# Patient Record
Sex: Female | Born: 1990
Health system: Southern US, Community
[De-identification: ages and names within clinical notes are randomized; demographics above are authoritative.]

## PROBLEM LIST (undated history)

## (undated) DIAGNOSIS — K900 Celiac disease: Secondary | ICD-10-CM

## (undated) DIAGNOSIS — K219 Gastro-esophageal reflux disease without esophagitis: Secondary | ICD-10-CM

## (undated) DIAGNOSIS — D649 Anemia, unspecified: Secondary | ICD-10-CM

## (undated) DIAGNOSIS — F419 Anxiety disorder, unspecified: Secondary | ICD-10-CM

## (undated) DIAGNOSIS — F502 Bulimia nervosa, unspecified: Secondary | ICD-10-CM

## (undated) DIAGNOSIS — T7840XA Allergy, unspecified, initial encounter: Secondary | ICD-10-CM

## (undated) DIAGNOSIS — R630 Anorexia: Secondary | ICD-10-CM

## (undated) DIAGNOSIS — F32A Depression, unspecified: Secondary | ICD-10-CM

## (undated) HISTORY — PX: TONSILLECTOMY: SUR1361

## (undated) HISTORY — DX: Celiac disease: K90.0

## (undated) HISTORY — DX: Allergy, unspecified, initial encounter: T78.40XA

## (undated) HISTORY — DX: Depression, unspecified: F32.A

## (undated) HISTORY — DX: Anxiety disorder, unspecified: F41.9

## (undated) HISTORY — DX: Bulimia nervosa, unspecified: F50.20

## (undated) HISTORY — DX: Gastro-esophageal reflux disease without esophagitis: K21.9

## (undated) HISTORY — DX: Anemia, unspecified: D64.9

## (undated) HISTORY — DX: Bulimia nervosa: F50.2

## (undated) HISTORY — PX: BIOPSY BOWEL: PRO7

## (undated) HISTORY — DX: Anorexia: R63.0

## (undated) HISTORY — PX: INCISION AND DRAINAGE: SHX5863

---
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
## 2012-01-02 ENCOUNTER — Other Ambulatory Visit: Payer: Self-pay | Admitting: Otolaryngology

## 2012-01-02 NOTE — H&P (Signed)
  Beth Hayes, Beth Hayes 21 y.o., female [id number]     Chief Complaint: Recurrent Tonsillitis  HPI:   Several months recheck.  She continues to have occasional sore throats off at [employer].  We are planning on doing tonsillectomy, possible adenoidectomy over her Christmas break.   We discussed the surgery in detail including risks and complications.  Questions were answered and informed consent was obtained.  Postoperative prescriptions for Roxicet and Lortab liquid given.  Preoperative prophylactic prescription for amoxicillin tablets given.  I discussed advancement of diet and activity including return to school.  Recheck here one week postoperative.  She and her father understand and agree.  PMH: Past Medical History  Diagnosis Date  . Celiac sprue   . Nasal congestion 12/30/2011  . Recurrent tonsillitis 12/2011  . Amoxicillin rash 12/30/2011    Surg Hx: Past Surgical History  Procedure Date  . Tonsillectomy 11/26/2010    Procedure: TONSILLECTOMY;  Surgeon: Tyson Alias;  Location: [facility];  Service: ENT;  Laterality: Right;  I & D right peritonsillar abscess  . Incision and drainage of peritonsillar abcess 11/26/2010    was not a tonsillectomy  . Biopsy bowel age 55 mos. and age 70    FHx:  No family history on file. SocHx:  reports that she has never smoked. She has never used smokeless tobacco. She reports that she does not drink alcohol or use illicit drugs.  ALLERGIES:  Allergies  Allergen Reactions  . Gluten Meal Other (See Comments)    DUE TO CELIAC DISEASE  . Amoxicillin Rash     (Not in a hospital admission)  No results found for this or any previous visit (from the past 48 hour(s)). No results found.   Last menstrual period 12/21/2011.  PHYSICAL EXAM:  BP:102/65,  HR: 59 b/min,  Height: 59 in, 2-20 Stature Percentile: 1 %,  Weight: 83 lb, BMI: 16.8 kg/m2,    She is short and trim.  Mental status is appropriate.  She hears well in  conversational speech.  Voice is clear and respirations unlabored through the nose.  No velopharyngeal insufficiency.  Ears are clear.  Anterior nose is moist and patent.  Oral cavity reveals teeth in good repair.  3+ tonsils with a normal soft palate.  Neck unremarkable.   Lungs: Clear to auscultation Heart: Regular rate and rhythm without murmur Abdomen: Soft, active Extremities: Normal configuration Neurologic: Symmetric and intact    Assessment/Plan Peritonsillar abscess (475) (J36).  Chronic tonsillitis (474.00) (J35.01).  We will remove your tonsils, and adenoids, if any.  Plan on being out of most activities for the first week, and no strenuous activity for 2 weeks.  You should be safe to travel to [place].  Recheck here 1 week post op.  Drink plenty of fluids, and advance diet as comfortable.  OxyCODONE HCl - 5 MG/5ML Oral Solution;USE AS DIRECTED; Qty200; R0; Rx. Hydrocodone-Acetaminophen 7.5-500 MG/15ML Oral Solution;1-3 tsp po q4h prn pain; FYB017; R2; Rx.  Erik Obey, Nalia Honeycutt 51/02/5850, 4:55 PM

## 2012-01-03 ENCOUNTER — Ambulatory Visit (HOSPITAL_BASED_OUTPATIENT_CLINIC_OR_DEPARTMENT_OTHER)
Admission: RE | Admit: 2012-01-03 | Discharge: 2012-01-03 | Disposition: A | Discharge status: Home / Self Care | Payer: 59 | Source: Ambulatory Visit | Attending: Otolaryngology | Admitting: Otolaryngology

## 2012-01-03 ENCOUNTER — Encounter (HOSPITAL_BASED_OUTPATIENT_CLINIC_OR_DEPARTMENT_OTHER): Payer: Self-pay | Admitting: *Deleted

## 2012-01-03 ENCOUNTER — Encounter (HOSPITAL_BASED_OUTPATIENT_CLINIC_OR_DEPARTMENT_OTHER): Payer: Self-pay | Admitting: Anesthesiology

## 2012-01-03 ENCOUNTER — Ambulatory Visit (HOSPITAL_BASED_OUTPATIENT_CLINIC_OR_DEPARTMENT_OTHER): Payer: 59 | Admitting: Anesthesiology

## 2012-01-03 ENCOUNTER — Encounter (HOSPITAL_BASED_OUTPATIENT_CLINIC_OR_DEPARTMENT_OTHER)
Admission: RE | Disposition: A | Discharge status: Home / Self Care | Payer: Self-pay | Source: Ambulatory Visit | Attending: Otolaryngology

## 2012-01-03 DIAGNOSIS — J3501 Chronic tonsillitis: Secondary | ICD-10-CM

## 2012-01-03 DIAGNOSIS — J3503 Chronic tonsillitis and adenoiditis: Secondary | ICD-10-CM | POA: Insufficient documentation

## 2012-01-03 DIAGNOSIS — Z880 Allergy status to penicillin: Secondary | ICD-10-CM | POA: Insufficient documentation

## 2012-01-03 HISTORY — PX: TONSILLECTOMY: SHX5217

## 2012-01-03 HISTORY — PX: ADENOIDECTOMY: SHX5191

## 2012-01-03 LAB — POCT HEMOGLOBIN-HEMACUE: Hemoglobin: 13.3 g/dL (ref 12.0–15.0)

## 2012-01-03 SURGERY — TONSILLECTOMY
Anesthesia: General | Site: Throat | Wound class: Clean Contaminated

## 2012-01-03 MED ORDER — LIDOCAINE-EPINEPHRINE 0.5 %-1:200000 IJ SOLN
INTRAMUSCULAR | Status: DC | PRN
  Administered 2012-01-03: 9 mL

## 2012-01-03 MED ORDER — HYDROMORPHONE HCL PF 1 MG/ML IJ SOLN
0.2500 mg | INTRAMUSCULAR | Status: DC | PRN
  Administered 2012-01-03 (×2): 0.5 mg via INTRAVENOUS

## 2012-01-03 MED ORDER — SODIUM CHLORIDE 0.9 % IV SOLN: 1.0000 g | Freq: Once | INTRAVENOUS | Status: DC

## 2012-01-03 MED ORDER — MEPERIDINE HCL 25 MG/ML IJ SOLN: 6.2500 mg | INTRAMUSCULAR | Status: DC | PRN

## 2012-01-03 MED ORDER — MIDAZOLAM HCL 5 MG/5ML IJ SOLN
INTRAMUSCULAR | Status: DC | PRN
  Administered 2012-01-03: 1 mg via INTRAVENOUS

## 2012-01-03 MED ORDER — CLINDAMYCIN PHOSPHATE 600 MG/50ML IV SOLN
600.0000 mg | Freq: Once | INTRAVENOUS | Status: AC
  Administered 2012-01-03: 600 mg via INTRAVENOUS

## 2012-01-03 MED ORDER — OXYCODONE HCL 5 MG/5ML PO SOLN: 5.0000 mg | Freq: Once | ORAL | Status: DC | PRN

## 2012-01-03 MED ORDER — ONDANSETRON HCL 4 MG/2ML IJ SOLN
INTRAMUSCULAR | Status: DC | PRN
  Administered 2012-01-03: 4 mg via INTRAVENOUS

## 2012-01-03 MED ORDER — SUCCINYLCHOLINE CHLORIDE 20 MG/ML IJ SOLN
INTRAMUSCULAR | Status: DC | PRN
  Administered 2012-01-03: 50 mg via INTRAVENOUS

## 2012-01-03 MED ORDER — SODIUM CHLORIDE 0.9 % IV SOLN
INTRAVENOUS | Status: DC
  Administered 2012-01-03: 100 mL/h via INTRAVENOUS

## 2012-01-03 MED ORDER — HYDROCODONE-ACETAMINOPHEN 7.5-500 MG/15ML PO SOLN
10.0000 mL | ORAL | Status: DC | PRN
  Administered 2012-01-03: 10 mL via ORAL

## 2012-01-03 MED ORDER — DEXAMETHASONE SODIUM PHOSPHATE 10 MG/ML IJ SOLN: 6.0000 mg | Freq: Once | INTRAMUSCULAR | Status: DC

## 2012-01-03 MED ORDER — DEXAMETHASONE SODIUM PHOSPHATE 4 MG/ML IJ SOLN
INTRAMUSCULAR | Status: DC | PRN
  Administered 2012-01-03: 6 mg via INTRAVENOUS

## 2012-01-03 MED ORDER — FENTANYL CITRATE 0.05 MG/ML IJ SOLN
INTRAMUSCULAR | Status: DC | PRN
  Administered 2012-01-03 (×2): 50 ug via INTRAVENOUS

## 2012-01-03 MED ORDER — ONDANSETRON HCL 4 MG/2ML IJ SOLN: 4.0000 mg | INTRAMUSCULAR | Status: DC | PRN

## 2012-01-03 MED ORDER — PROMETHAZINE HCL 25 MG/ML IJ SOLN: 6.2500 mg | INTRAMUSCULAR | Status: DC | PRN

## 2012-01-03 MED ORDER — LACTATED RINGERS IV SOLN
INTRAVENOUS | Status: DC
  Administered 2012-01-03: 20 mL/h via INTRAVENOUS
  Administered 2012-01-03: 09:00:00 via INTRAVENOUS

## 2012-01-03 MED ORDER — OXYCODONE HCL 5 MG PO TABS: 5.0000 mg | ORAL_TABLET | Freq: Once | ORAL | Status: DC | PRN

## 2012-01-03 MED ORDER — ONDANSETRON HCL 4 MG PO TABS: 4.0000 mg | ORAL_TABLET | ORAL | Status: DC | PRN

## 2012-01-03 MED ORDER — PROPOFOL 10 MG/ML IV BOLUS
INTRAVENOUS | Status: DC | PRN
  Administered 2012-01-03: 150 mg via INTRAVENOUS

## 2012-01-03 MED ORDER — LIDOCAINE HCL (CARDIAC) 20 MG/ML IV SOLN
INTRAVENOUS | Status: DC | PRN
  Administered 2012-01-03: 50 mg via INTRAVENOUS

## 2012-01-03 SURGICAL SUPPLY — 33 items
CANISTER SUCTION 1200CC (MISCELLANEOUS) ×2 IMPLANT
CATH ROBINSON RED A/P 10FR (CATHETERS) ×2 IMPLANT
CLEANER CAUTERY TIP 5X5 PAD (MISCELLANEOUS) ×1 IMPLANT
CLOTH BEACON ORANGE TIMEOUT ST (SAFETY) ×2 IMPLANT
COAGULATOR SUCT SWTCH 10FR 6 (ELECTROSURGICAL) ×2 IMPLANT
COVER MAYO STAND STRL (DRAPES) ×2 IMPLANT
DECANTER SPIKE VIAL GLASS SM (MISCELLANEOUS) IMPLANT
ELECT COATED BLADE 2.86 ST (ELECTRODE) ×2 IMPLANT
ELECT REM PT RETURN 9FT ADLT (ELECTROSURGICAL) ×2
ELECT REM PT RETURN 9FT PED (ELECTROSURGICAL)
ELECTRODE REM PT RETRN 9FT PED (ELECTROSURGICAL) IMPLANT
ELECTRODE REM PT RTRN 9FT ADLT (ELECTROSURGICAL) ×1 IMPLANT
GAUZE SPONGE 4X4 12PLY STRL LF (GAUZE/BANDAGES/DRESSINGS) ×2 IMPLANT
GLOVE ECLIPSE 8.0 STRL XLNG CF (GLOVE) ×2 IMPLANT
GLOVE SKINSENSE NS SZ7.0 (GLOVE) ×1
GLOVE SKINSENSE STRL SZ7.0 (GLOVE) ×1 IMPLANT
GOWN PREVENTION PLUS XLARGE (GOWN DISPOSABLE) ×2 IMPLANT
GOWN PREVENTION PLUS XXLARGE (GOWN DISPOSABLE) ×2 IMPLANT
MARKER SKIN DUAL TIP RULER LAB (MISCELLANEOUS) ×2 IMPLANT
NEEDLE SPNL 22GX3.5 QUINCKE BK (NEEDLE) ×2 IMPLANT
NS IRRIG 1000ML POUR BTL (IV SOLUTION) ×2 IMPLANT
PAD CLEANER CAUTERY TIP 5X5 (MISCELLANEOUS) ×1
PENCIL FOOT CONTROL (ELECTRODE) ×2 IMPLANT
SHEET MEDIUM DRAPE 40X70 STRL (DRAPES) ×2 IMPLANT
SPONGE TONSIL 1 RF SGL (DISPOSABLE) IMPLANT
SPONGE TONSIL 1.25 RF SGL STRG (GAUZE/BANDAGES/DRESSINGS) ×2 IMPLANT
SYR BULB 3OZ (MISCELLANEOUS) ×2 IMPLANT
SYR CONTROL 10ML LL (SYRINGE) ×2 IMPLANT
TOWEL OR 17X24 6PK STRL BLUE (TOWEL DISPOSABLE) ×2 IMPLANT
TUBE CONNECTING 20X1/4 (TUBING) ×2 IMPLANT
TUBE SALEM SUMP 12R W/ARV (TUBING) IMPLANT
TUBE SALEM SUMP 16 FR W/ARV (TUBING) ×2 IMPLANT
WATER STERILE IRR 1000ML POUR (IV SOLUTION) IMPLANT

## 2012-01-03 NOTE — Anesthesia Preprocedure Evaluation (Signed)
Anesthesia Evaluation  Patient identified by MRN, date of birth, ID band Patient awake    Reviewed: Allergy & Precautions, H&P , NPO status , Patient's Chart, lab work & pertinent test results  History of Anesthesia Complications Negative for: history of anesthetic complications  Airway Mallampati: I      Dental No notable dental hx. (+) Teeth Intact   Pulmonary neg pulmonary ROS,  breath sounds clear to auscultation  Pulmonary exam normal       Cardiovascular negative cardio ROS  IRhythm:regular Rate:Normal     Neuro/Psych negative neurological ROS  negative psych ROS   GI/Hepatic negative GI ROS, Neg liver ROS, Hx of celiac disease   Endo/Other  negative endocrine ROS  Renal/GU negative Renal ROS  negative genitourinary   Musculoskeletal   Abdominal   Peds  Hematology negative hematology ROS (+)   Anesthesia Other Findings   Reproductive/Obstetrics negative OB ROS                           Anesthesia Physical Anesthesia Plan  ASA: I  Anesthesia Plan: General and General ETT   Post-op Pain Management:    Induction:   Airway Management Planned:   Additional Equipment:   Intra-op Plan:   Post-operative Plan:   Informed Consent: I have reviewed the patients History and Physical, chart, labs and discussed the procedure including the risks, benefits and alternatives for the proposed anesthesia with the patient or authorized representative who has indicated his/her understanding and acceptance.   Dental Advisory Given  Plan Discussed with: CRNA and Surgeon  Anesthesia Plan Comments:         Anesthesia Quick Evaluation

## 2012-01-03 NOTE — H&P (View-Only) (Signed)
  Beth Hayes, Beth Hayes 21 y.o., female [id number]     Chief Complaint: Recurrent Tonsillitis  HPI:   Several months recheck.  She continues to have occasional sore throats off at [employer].  We are planning on doing tonsillectomy, possible adenoidectomy over her Christmas break.   We discussed the surgery in detail including risks and complications.  Questions were answered and informed consent was obtained.  Postoperative prescriptions for Roxicet and Lortab liquid given.  Preoperative prophylactic prescription for amoxicillin tablets given.  I discussed advancement of diet and activity including return to school.  Recheck here one week postoperative.  She and her father understand and agree.  PMH: Past Medical History  Diagnosis Date  . Celiac sprue   . Nasal congestion 12/30/2011  . Recurrent tonsillitis 12/2011  . Amoxicillin rash 12/30/2011    Surg Hx: Past Surgical History  Procedure Date  . Tonsillectomy 11/26/2010    Procedure: TONSILLECTOMY;  Surgeon: Tyson Alias;  Location: [facility];  Service: ENT;  Laterality: Right;  I & D right peritonsillar abscess  . Incision and drainage of peritonsillar abcess 11/26/2010    was not a tonsillectomy  . Biopsy bowel age 55 mos. and age 71    FHx:  No family history on file. SocHx:  reports that she has never smoked. She has never used smokeless tobacco. She reports that she does not drink alcohol or use illicit drugs.  ALLERGIES:  Allergies  Allergen Reactions  . Gluten Meal Other (See Comments)    DUE TO CELIAC DISEASE  . Amoxicillin Rash     (Not in a hospital admission)  No results found for this or any previous visit (from the past 48 hour(s)). No results found.   Last menstrual period 12/21/2011.  PHYSICAL EXAM:  BP:102/65,  HR: 59 b/min,  Height: 59 in, 2-20 Stature Percentile: 1 %,  Weight: 83 lb, BMI: 16.8 kg/m2,    She is short and trim.  Mental status is appropriate.  She hears well in  conversational speech.  Voice is clear and respirations unlabored through the nose.  No velopharyngeal insufficiency.  Ears are clear.  Anterior nose is moist and patent.  Oral cavity reveals teeth in good repair.  3+ tonsils with a normal soft palate.  Neck unremarkable.   Lungs: Clear to auscultation Heart: Regular rate and rhythm without murmur Abdomen: Soft, active Extremities: Normal configuration Neurologic: Symmetric and intact    Assessment/Plan Peritonsillar abscess (475) (J36).  Chronic tonsillitis (474.00) (J35.01).  We will remove your tonsils, and adenoids, if any.  Plan on being out of most activities for the first week, and no strenuous activity for 2 weeks.  You should be safe to travel to [place].  Recheck here 1 week post op.  Drink plenty of fluids, and advance diet as comfortable.  OxyCODONE HCl - 5 MG/5ML Oral Solution;USE AS DIRECTED; Qty200; R0; Rx. Hydrocodone-Acetaminophen 7.5-500 MG/15ML Oral Solution;1-3 tsp po q4h prn pain; [id number]; R2; Rx.  Erik Obey, Crystle Carelli 59/45/8592, 4:55 PM

## 2012-01-03 NOTE — Anesthesia Procedure Notes (Signed)
Procedure Name: Intubation Date/Time: 01/03/2012 10:58 AM Performed by: Melynda Ripple D Pre-anesthesia Checklist: Patient identified, Emergency Drugs available, Suction available and Patient being monitored Patient Re-evaluated:Patient Re-evaluated prior to inductionOxygen Delivery Method: Circle System Utilized Preoxygenation: Pre-oxygenation with 100% oxygen Intubation Type: IV induction Ventilation: Mask ventilation without difficulty Grade View: Grade I Tube type: Oral Tube size: 6.0 mm Number of attempts: 1 Airway Equipment and Method: stylet and oral airway Placement Confirmation: ETT inserted through vocal cords under direct vision,  positive ETCO2 and breath sounds checked- equal and bilateral Secured at: 20 cm Tube secured with: Tape Dental Injury: Teeth and Oropharynx as per pre-operative assessment

## 2012-01-03 NOTE — Transfer of Care (Signed)
Immediate Anesthesia Transfer of Care Note  Patient: Beth Hayes  Procedure(s) Performed: Procedure(s) (LRB) with comments: TONSILLECTOMY (N/A) ADENOIDECTOMY (N/A)  Patient Location: PACU  Anesthesia Type:General  Level of Consciousness: awake and alert   Airway & Oxygen Therapy: Patient Spontanous Breathing and Patient connected to face mask oxygen  Post-op Assessment: Report given to PACU RN and Post -op Vital signs reviewed and stable  Post vital signs: Reviewed and stable  Complications: No apparent anesthesia complications

## 2012-01-03 NOTE — Op Note (Signed)
01/03/2012  11:31 AM    Lockie Pares  [id number]   Pre-Op Dx:  Chronic and recurrent tonsillitis including abscess  Post-op Dx: Same  Proc: Tonsillectomy, adenoid ablation   Surg:  Jodi Marble T MD  Anes:  GOT  EBL:  Minimal  Comp:  None  Findings:  Bulky 2+ fibrotic tonsils. A scar on the right anterior tonsil pillar consistent with incision and drainage, peritonsillar abscess. Modest residual adenoid pad.  Procedure:  With the patient in a comfortable supine position,  general orotracheal anesthesia was induced without difficulty.  At an appropriate level, the patient was turned 90 away from anesthesia and placed in Trendelenburg.  A clean preparation and draping was accomplished.  Taking care to protect lips, teeth, and endotracheal tube, the Crowe-Davis mouth gag was introduced, expanded for visualization, and suspended from the [place] stand in the standard fashion.  The findings were as described above.  Palate  retractor  and mirror were used to examine the nasopharynx with the findings as described above.   Anterior nose was examined with a nasal speculum with the findings as described above.  1/2% Xylocaine with 1:200,000 epinephrine, 8 cc's, was infiltrated into the peritonsillar planes on both sides for intraoperative hemostasis.  Several minutes were allowed for this to take effect.  A red rubber catheter was passed through the nose and out the mouth to serve as a Freight forwarder. Using suction cautery and indirect visualization, the adenoid pad was ablated. Hemostasis was observed.  Beginning on the  left side, the tonsil was grasped and retracted medially.  The mucosa over the anterior and superior poles was coagulated and then cut down to the capsule of the tonsil.  Using the cautery tip as a blunt dissector, the tonsil was dissected from its muscular fossa from anterior to posterior and from superior to inferior.  Fibrous bands were lysed as necessary.  Crossing  vessels were coagulated as identified.  The tonsil was removed in its entirety as determined by examination of both tonsil and fossa.  A small additional quantity of cautery rendered the fossa hemostatic.    After completing the 1st tonsillectomy, the 2nd one was performed in identical fashion.  There was additional fibrosis on the right side consistent with a prior abscess.    At this point the palate retractor and mouthgag were relaxed for several minutes.  Upon reexpansion,  Hemostasis was observed.  The palate retractor and mouthgag were relaxed and removed. The dental status was intact.    At this point the procedure was completed.  The patient was returned to anesthesia, awakened, extubated, and transferred to recovery in stable condition.   Dispo:  OR to PACU.   Will observe for six hours, overnight if necessary and then discharged to home in care of family.  Plan:  Analgesia, hydration, limited activity for two weeks.  Advance diet as comfortable.  Return to school or work at 10 days.  Tyson Alias  MD.

## 2012-01-03 NOTE — Anesthesia Postprocedure Evaluation (Signed)
  Anesthesia Post-op Note  Patient: Beth Hayes  Procedure(s) Performed: Procedure(s) (LRB) with comments: TONSILLECTOMY (N/A) ADENOIDECTOMY (N/A)  Patient Location: PACU  Anesthesia Type:General  Level of Consciousness: awake, alert  and oriented  Airway and Oxygen Therapy: Patient Spontanous Breathing  Post-op Pain: mild  Post-op Assessment: Post-op Vital signs reviewed  Post-op Vital Signs: Reviewed  Complications: No apparent anesthesia complications

## 2012-01-03 NOTE — Progress Notes (Signed)
There is no information here.  What am I supposed to do with this?

## 2012-01-03 NOTE — Interval H&P Note (Signed)
History and Physical Interval Note:  01/03/2012 10:21 AM  Beth Hayes  has presented today for surgery, with the diagnosis of Recurrent Tonsilitis  The various methods of treatment have been discussed with the patient and family. After consideration of risks, benefits and other options for treatment, the patient has consented to  Procedure(s) (LRB) with comments: TONSILLECTOMY (N/A) ADENOIDECTOMY (N/A) as a surgical intervention .  The patient's history has been re-reviewed, patient re-examined, no change in status, stable for surgery.  I have re-reviewed the patient's chart and labs.  Questions were answered to the patient's satisfaction.     Jodi Marble

## 2012-01-04 ENCOUNTER — Encounter (HOSPITAL_BASED_OUTPATIENT_CLINIC_OR_DEPARTMENT_OTHER): Payer: Self-pay | Admitting: Otolaryngology

## 2012-08-31 ENCOUNTER — Encounter: Payer: Self-pay | Admitting: Certified Nurse Midwife

## 2012-08-31 ENCOUNTER — Ambulatory Visit (INDEPENDENT_AMBULATORY_CARE_PROVIDER_SITE_OTHER): Payer: 59 | Admitting: Certified Nurse Midwife

## 2012-08-31 VITALS — BP 98/60 | HR 60 | Resp 16 | Ht 59.25 in | Wt 87.0 lb

## 2012-08-31 DIAGNOSIS — Z0000 Encounter for general adult medical examination without abnormal findings: Secondary | ICD-10-CM

## 2012-08-31 DIAGNOSIS — Z01419 Encounter for gynecological examination (general) (routine) without abnormal findings: Secondary | ICD-10-CM

## 2012-08-31 LAB — CBC
Platelets: 314 10*3/uL (ref 150–400)
RBC: 4.82 MIL/uL (ref 3.87–5.11)
WBC: 5.7 10*3/uL (ref 4.0–10.5)

## 2012-08-31 LAB — HIV ANTIBODY (ROUTINE TESTING W REFLEX): HIV: NONREACTIVE

## 2012-08-31 LAB — RPR

## 2012-08-31 NOTE — Progress Notes (Signed)
22 y.o. go po Single Caucasian Fe here for annual exam.  Periods normal 28-32 day cycle. Weight normal for her and family per patient are all small. Contraception condoms,working well. Desires STD screening, one partner ever. Patient has Celiac disease, but well controlled now with diet. No health issues today. Sophomore in college! Patient's last menstrual period was 08/22/2012.          Sexually active: yes  The current method of family planning is condoms all the time.    Exercising: yes  running & walking Smoker:  no  Health Maintenance: Pap:  none MMG:  none Colonoscopy:  none BMD:   none TDaP:  2013 Labs: none Self breast exam: not done   reports that she has never smoked. She has never used smokeless tobacco. She reports that she does not drink alcohol or use illicit drugs.  Past Medical History  Diagnosis Date  . Celiac sprue   . Nasal congestion 12/30/2011  . Recurrent tonsillitis 12/2011  . Amoxicillin rash 12/30/2011    Past Surgical History  Procedure Laterality Date  . Tonsillectomy  11/26/2010    Procedure: TONSILLECTOMY;  Surgeon: Tyson Alias;  Location: [facility];  Service: ENT;  Laterality: Right;  I & D right peritonsillar abscess  . Incision and drainage of peritonsillar abcess  11/26/2010    was not a tonsillectomy  . Biopsy bowel  age 60 mos. and age 80  . Tonsillectomy  01/03/2012    Procedure: TONSILLECTOMY;  Surgeon: Jodi Marble, MD;  Location: [facility];  Service: ENT;  Laterality: N/A;  . Adenoidectomy  01/03/2012    Procedure: ADENOIDECTOMY;  Surgeon: Jodi Marble, MD;  Location: [facility];  Service: ENT;  Laterality: N/A;    No current outpatient prescriptions on file.   No current facility-administered medications for this visit.    Family History  Problem Relation Age of Onset  . Cancer Paternal Grandfather     pancreatic    ROS:  Pertinent items are noted in HPI.  Otherwise, a  comprehensive ROS was negative.  Exam:   BP 98/60  Pulse 60  Resp 16  Ht 4' 11.25" (1.505 m)  Wt 87 lb (39.463 kg)  BMI 17.42 kg/m2  LMP 08/22/2012 Height: 4' 11.25" (150.5 cm)  Ht Readings from Last 3 Encounters:  08/31/12 4' 11.25" (1.505 m)  12/30/11 4' 11"  (1.499 m) (2%*, Z = -2.06)  12/30/11 4' 11"  (1.499 m) (2%*, Z = -2.06)   * Growth percentiles are based on CDC 2-20 Years data.    General appearance: alert, cooperative and appears stated age Head: Normocephalic, without obvious abnormality, atraumatic Neck: no adenopathy, supple, symmetrical, trachea midline and thyroid normal to inspection and palpation Lungs: clear to auscultation bilaterally Breasts: normal appearance, no masses or tenderness, No nipple retraction or dimpling, No nipple discharge or bleeding, No axillary or supraclavicular adenopathy Heart: regular rate and rhythm Abdomen: soft, non-tender; no masses,  no organomegaly Extremities: extremities normal, atraumatic, no cyanosis or edema Skin: Skin color, texture, turgor normal. No rashes or lesions Lymph nodes: Cervical, supraclavicular, and axillary nodes normal. No abnormal inguinal nodes palpated Neurologic: Grossly normal   Pelvic: External genitalia:  no lesions              Urethra:  normal appearing urethra with no masses, tenderness or lesions              Bartholin's and Skene's: normal  Vagina: normal appearing vagina with normal color and discharge, no lesions              Cervix: normal non tender              Pap taken: no Bimanual Exam:  Uterus:  normal size, contour, position, consistency, mobility, non-tender and anteverted              Adnexa: normal adnexa and no mass, fullness, tenderness               Rectovaginal: Confirms               Anus:  normal sphincter tone, no lesions  A:  Well Woman with normal exam  Contraception condoms  STD screening  History of Celiac disease  P:   Reviewed health and wellness  pertinent to exam  Stressed consistent use for protection  Lab:GC,Chlamydia,HIV, RPR, CBC  Continue diet as recommended and follow up as indicated  Pap smear as per guidelines   pap smear not taken today start at age 69  counseled on breast self exam, STD prevention, HIV risk factors and prevention, adequate intake of calcium and vitamin D, diet and exercise  return annually or prn  An After Visit Summary was printed and given to the patient.

## 2012-08-31 NOTE — Patient Instructions (Signed)
General topics  Next pap or exam is  due in 1 year Take a Women's multivitamin Take 1200 mg. of calcium daily - prefer dietary If any concerns in interim to call back  Breast Self-Awareness Practicing breast self-awareness may pick up problems early, prevent significant medical complications, and possibly save your life. By practicing breast self-awareness, you can become familiar with how your breasts look and feel and if your breasts are changing. This allows you to notice changes early. It can also offer you some reassurance that your breast health is good. One way to learn what is normal for your breasts and whether your breasts are changing is to do a breast self-exam. If you find a lump or something that was not present in the past, it is best to contact your caregiver right away. Other findings that should be evaluated by your caregiver include nipple discharge, especially if it is bloody; skin changes or reddening; areas where the skin seems to be pulled in (retracted); or new lumps and bumps. Breast pain is seldom associated with cancer (malignancy), but should also be evaluated by a caregiver. BREAST SELF-EXAM The best time to examine your breasts is 5 7 days after your menstrual period is over.  ExitCare Patient Information 2013 [employer].   Exercise to Stay Healthy Exercise helps you become and stay healthy. EXERCISE IDEAS AND TIPS Choose exercises that:  You enjoy.  Fit into your day. You do not need to exercise really hard to be healthy. You can do exercises at a slow or medium level and stay healthy. You can:  Stretch before and after working out.  Try yoga, Pilates, or tai chi.  Lift weights.  Walk fast, swim, jog, run, climb stairs, bicycle, dance, or rollerskate.  Take aerobic classes. Exercises that burn about 150 calories:  Running 1  miles in 15 minutes.  Playing volleyball for 45 to 60 minutes.  Washing and waxing a car for 45 to 60  minutes.  Playing touch football for 45 minutes.  Walking 1  miles in 35 minutes.  Pushing a stroller 1  miles in 30 minutes.  Playing basketball for 30 minutes.  Raking leaves for 30 minutes.  Bicycling 5 miles in 30 minutes.  Walking 2 miles in 30 minutes.  Dancing for 30 minutes.  Shoveling snow for 15 minutes.  Swimming laps for 20 minutes.  Walking up stairs for 15 minutes.  Bicycling 4 miles in 15 minutes.  Gardening for 30 to 45 minutes.  Jumping rope for 15 minutes.  Washing windows or floors for 45 to 60 minutes. Document Released: 02/06/2010 Document Revised: 03/29/2011 Document Reviewed: 02/06/2010 ExitCare Patient Information 2013 [employer].   Other topics ( that may be useful information):    Sexually Transmitted Disease Sexually transmitted disease (STD) refers to any infection that is passed from person to person during sexual activity. This may happen by way of saliva, semen, blood, vaginal mucus, or urine. Common STDs include:  Gonorrhea.  Chlamydia.  Syphilis.  HIV/AIDS.  Genital herpes.  Hepatitis B and C.  Trichomonas.  Human papillomavirus (HPV).  Pubic lice. CAUSES  An STD may be spread by bacteria, virus, or parasite. A person can get an STD by:  Sexual intercourse with an infected person.  Sharing sex toys with an infected person.  Sharing needles with an infected person.  Having intimate contact with the genitals, mouth, or rectal areas of an infected person. SYMPTOMS  Some people may not have any symptoms, but   they can still pass the infection to others. Different STDs have different symptoms. Symptoms include:  Painful or bloody urination.  Pain in the pelvis, abdomen, vagina, anus, throat, or eyes.  Skin rash, itching, irritation, growths, or sores (lesions). These usually occur in the genital or anal area.  Abnormal vaginal discharge.  Penile discharge in men.  Soft, flesh-colored skin growths in the  genital or anal area.  Fever.  Pain or bleeding during sexual intercourse.  Swollen glands in the groin area.  Yellow skin and eyes (jaundice). This is seen with hepatitis. DIAGNOSIS  To make a diagnosis, your caregiver may:  Take a medical history.  Perform a physical exam.  Take a specimen (culture) to be examined.  Examine a sample of discharge under a microscope.  Perform blood test TREATMENT   Chlamydia, gonorrhea, trichomonas, and syphilis can be cured with antibiotic medicine.  Genital herpes, hepatitis, and HIV can be treated, but not cured, with prescribed medicines. The medicines will lessen the symptoms.  Genital warts from HPV can be treated with medicine or by freezing, burning (electrocautery), or surgery. Warts may come back.  HPV is a virus and cannot be cured with medicine or surgery.However, abnormal areas may be followed very closely by your caregiver and may be removed from the cervix, vagina, or vulva through office procedures or surgery. If your diagnosis is confirmed, your recent sexual partners need treatment. This is true even if they are symptom-free or have a negative culture or evaluation. They should not have sex until their caregiver says it is okay. HOME CARE INSTRUCTIONS  All sexual partners should be informed, tested, and treated for all STDs.  Take your antibiotics as directed. Finish them even if you start to feel better.  Only take over-the-counter or prescription medicines for pain, discomfort, or fever as directed by your caregiver.  Rest.  Eat a balanced diet and drink enough fluids to keep your urine clear or pale yellow.  Do not have sex until treatment is completed and you have followed up with your caregiver. STDs should be checked after treatment.  Keep all follow-up appointments, Pap tests, and blood tests as directed by your caregiver.  Only use latex condoms and water-soluble lubricants during sexual activity. Do not use  petroleum jelly or oils.  Avoid alcohol and illegal drugs.  Get vaccinated for HPV and hepatitis. If you have not received these vaccines in the past, talk to your caregiver about whether one or both might be right for you.  Avoid risky sex practices that can break the skin. The only way to avoid getting an STD is to avoid all sexual activity.Latex condoms and dental dams (for oral sex) will help lessen the risk of getting an STD, but will not completely eliminate the risk. SEEK MEDICAL CARE IF:   You have a fever.  You have any new or worsening symptoms. Document Released: 03/27/2002 Document Revised: 03/29/2011 Document Reviewed: 04/03/2010 ExitCare Patient Information 2013 [employer].    Domestic Abuse You are being battered or abused if someone close to you hits, pushes, or physically hurts you in any way. You also are being abused if you are forced into activities. You are being sexually abused if you are forced to have sexual contact of any kind. You are being emotionally abused if you are made to feel worthless or if you are constantly threatened. It is important to remember that help is available. No one has the right to abuse you. PREVENTION OF FURTHER   ABUSE  Learn the warning signs of danger. This varies with situations but may include: the use of alcohol, threats, isolation from friends and family, or forced sexual contact. Leave if you feel that violence is going to occur.  If you are attacked or beaten, report it to the police so the abuse is documented. You do not have to press charges. The police can protect you while you or the attackers are leaving. Get the officer's name and badge number and a copy of the report.  Find someone you can trust and tell them what is happening to you: your caregiver, a nurse, clergy member, close friend or family member. Feeling ashamed is natural, but remember that you have done nothing wrong. No one deserves abuse. Document Released:  01/02/2000 Document Revised: 03/29/2011 Document Reviewed: 03/12/2010 ExitCare Patient Information 2013 [employer].    How Much is Too Much Alcohol? Drinking too much alcohol can cause injury, accidents, and health problems. These types of problems can include:   Car crashes.  Falls.  Family fighting (domestic violence).  Drowning.  Fights.  Injuries.  Burns.  Damage to certain organs.  Having a baby with birth defects. ONE DRINK CAN BE TOO MUCH WHEN YOU ARE:  Working.  Pregnant or breastfeeding.  Taking medicines. Ask your doctor.  Driving or planning to drive. If you or someone you know has a drinking problem, get help from a doctor.  Document Released: 10/31/2008 Document Revised: 03/29/2011 Document Reviewed: 10/31/2008 ExitCare Patient Information 2013 [employer].   Smoking Hazards Smoking cigarettes is extremely bad for your health. Tobacco smoke has over 200 known poisons in it. There are over 60 chemicals in tobacco smoke that cause cancer. Some of the chemicals found in cigarette smoke include:   Cyanide.  Benzene.  Formaldehyde.  Methanol (wood alcohol).  Acetylene (fuel used in welding torches).  Ammonia. Cigarette smoke also contains the poisonous gases nitrogen oxide and carbon monoxide.  Cigarette smokers have an increased risk of many serious medical problems and Smoking causes approximately:  90% of all lung cancer deaths in men.  80% of all lung cancer deaths in women.  90% of deaths from chronic obstructive lung disease. Compared with nonsmokers, smoking increases the risk of:  Coronary heart disease by 2 to 4 times.  Stroke by 2 to 4 times.  Men developing lung cancer by 23 times.  Women developing lung cancer by 13 times.  Dying from chronic obstructive lung diseases by 12 times.  . Smoking is the most preventable cause of death and disease in our society.  WHY IS SMOKING ADDICTIVE?  Nicotine is the chemical  agent in tobacco that is capable of causing addiction or dependence.  When you smoke and inhale, nicotine is absorbed rapidly into the bloodstream through your lungs. Nicotine absorbed through the lungs is capable of creating a powerful addiction. Both inhaled and non-inhaled nicotine may be addictive.  Addiction studies of cigarettes and spit tobacco show that addiction to nicotine occurs mainly during the teen years, when young people begin using tobacco products. WHAT ARE THE BENEFITS OF QUITTING?  There are many health benefits to quitting smoking.   Likelihood of developing cancer and heart disease decreases. Health improvements are seen almost immediately.  Blood pressure, pulse rate, and breathing patterns start returning to normal soon after quitting. QUITTING SMOKING   American Lung Association - 1-800-LUNGUSA  American Cancer Society - 1-800-ACS-2345 Document Released: 02/12/2004 Document Revised: 03/29/2011 Document Reviewed: 10/16/2008 ExitCare Patient Information 2013 Exit[employer]   LL[employer].   Stress Management Stress is a state of physical or mental tension that often results from changes in your life or normal routine. Some common causes of stress are:  Death of a loved one.  Injuries or severe illnesses.  Getting fired or changing jobs.  Moving into a new home. Other causes may be:  Sexual problems.  Business or financial losses.  Taking on a large debt.  Regular conflict with someone at home or at work.  Constant tiredness from lack of sleep. It is not just bad things that are stressful. It may be stressful to:  Win the lottery.  Get married.  Buy a new car. The amount of stress that can be easily tolerated varies from person to person. Changes generally cause stress, regardless of the types of change. Too much stress can affect your health. It may lead to physical or emotional problems. Too little stress (boredom) may also become stressful. SUGGESTIONS TO  REDUCE STRESS:  Talk things over with your family and friends. It often is helpful to share your concerns and worries. If you feel your problem is serious, you may want to get help from a professional counselor.  Consider your problems one at a time instead of lumping them all together. Trying to take care of everything at once may seem impossible. List all the things you need to do and then start with the most important one. Set a goal to accomplish 2 or 3 things each day. If you expect to do too many in a single day you will naturally fail, causing you to feel even more stressed.  Do not use alcohol or drugs to relieve stress. Although you may feel better for a short time, they do not remove the problems that caused the stress. They can also be habit forming.  Exercise regularly - at least 3 times per week. Physical exercise can help to relieve that "uptight" feeling and will relax you.  The shortest distance between despair and hope is often a good night's sleep.  Go to bed and get up on time allowing yourself time for appointments without being rushed.  Take a short "time-out" period from any stressful situation that occurs during the day. Close your eyes and take some deep breaths. Starting with the muscles in your face, tense them, hold it for a few seconds, then relax. Repeat this with the muscles in your neck, shoulders, hand, stomach, back and legs.  Take good care of yourself. Eat a balanced diet and get plenty of rest.  Schedule time for having fun. Take a break from your daily routine to relax. HOME CARE INSTRUCTIONS   Call if you feel overwhelmed by your problems and feel you can no longer manage them on your own.  Return immediately if you feel like hurting yourself or someone else. Document Released: 06/30/2000 Document Revised: 03/29/2011 Document Reviewed: 02/20/2007 ExitCare Patient Information 2013 [employer].   

## 2012-08-31 NOTE — Progress Notes (Signed)
Note reviewed, agree with plan.  Xiong Haidar, MD  

## 2012-09-02 LAB — IPS N GONORRHOEA AND CHLAMYDIA BY PCR

## 2012-09-04 ENCOUNTER — Telehealth: Payer: Self-pay

## 2012-09-04 NOTE — Telephone Encounter (Signed)
Pt.notified

## 2012-09-04 NOTE — Telephone Encounter (Signed)
Message copied by Susy Manor on Mon Sep 04, 2012 10:52 AM ------      Message from: Regina Eck      Created: Mon Sep 04, 2012  8:13 AM       Notify GC/Chlamydia/RPR/HIV negative      CBC normal ------

## 2013-12-19 ENCOUNTER — Emergency Department
Admission: EM | Admit: 2013-12-19 | Discharge: 2013-12-19 | Disposition: A | Discharge status: Home / Self Care | Payer: 59 | Attending: Emergency Medicine | Admitting: Emergency Medicine

## 2013-12-19 ENCOUNTER — Emergency Department: Payer: Self-pay

## 2013-12-19 DIAGNOSIS — N3000 Acute cystitis without hematuria: Secondary | ICD-10-CM | POA: Insufficient documentation

## 2013-12-19 LAB — URINALYSIS
Glucose, UA: NEGATIVE
Nitrite, UA: NEGATIVE
Protein, UR: 100 — AB
Specific Gravity UA: 1.02 (ref 1.001–1.035)
Urine pH: 7 (ref 5.0–8.0)
Urobilinogen, UA: 1 mg/dL (ref 0.2–2.0)

## 2013-12-19 LAB — URINE HCG QUALITATIVE: Urine HCG Qualitative: NEGATIVE

## 2013-12-19 LAB — URINE MICROSCOPIC

## 2013-12-19 MED ORDER — NITROFURANTOIN MONOHYD MACRO 100 MG PO CAPS
100.00 mg | ORAL_CAPSULE | Freq: Two times a day (BID) | ORAL | Status: AC
Start: 2013-12-19 — End: ?

## 2013-12-19 MED ORDER — PHENAZOPYRIDINE HCL 200 MG PO TABS
200.0000 mg | ORAL_TABLET | Freq: Three times a day (TID) | ORAL | Status: AC | PRN
Start: 2013-12-19 — End: ?

## 2013-12-19 NOTE — ED Notes (Signed)
Pt c/o; burning upon urination, increased urgency, onset 11/18/13 @ 1600 hours

## 2013-12-19 NOTE — ED Provider Notes (Signed)
[facility]         CLINICAL SUMMARY          Diagnosis:    .     Final diagnoses:   Acute cystitis without hematuria         MDM Notes:      Paige Gibson is a 23 y.o. female with cystitis.      Disposition:      ED Disposition     Discharge Paige Gibson Po discharge to home/self care.    Condition at disposition: Stable            New Prescriptions    NITROFURANTOIN, MACROCRYSTAL-MONOHYDRATE, (MACROBID) 100 MG CAPSULE    Take 1 capsule (100 mg total) by mouth 2 (two) times daily.    PHENAZOPYRIDINE (PYRIDIUM) 200 MG TABLET    Take 1 tablet (200 mg total) by mouth 3 (three) times daily as needed for Pain.                 CLINICAL INFORMATION        HPI:      Chief Complaint: Urinary Tract Infection Symptoms  .    Paige Gibson is a 23 y.o. female who  has a past medical history of Celiac disease. and  has past surgical history that includes Tonsillectomy. who presents with 1 d worsening urinary frequency and pain,no fever,chills nausea or vomiting.     History obtained from: Patient      ROS:      Positive and negative ROS elements as per HPI.      Physical Exam:      Pulse 85  BP 138/80 mmHg  Resp 12  SpO2 99 %  Temp 97.9 F (36.6 C)    Constitutional: Vital signs reviewed. Well appearing.  Head: Normocephalic, atraumatic  Eyes: No conjunctival injection. No discharge.EOMI  ENT: Mucous membranes moist,Ears and throat wnl  Neck: Normal range of motion. Non-tender.  Respiratory/Chest: Clear to auscultation. No respiratory distress.   Cardiovascular: Regular rate and rhythm. No murmur.   Abdomen: Soft and non-tender. No guarding. No masses or hepatosplenomegaly.  Genitourinary:   UpperExtremity:FROM,no abnormality noted  LowerExtremity: No edema. No cyanosis.FROM  Neurological: No focal motor deficits by observation. Speech normal. Memory normal.  Skin: Warm and dry. No rash.  Lymphatic: No cervical lymphadenopathy.  Psychiatric: Normal affect.  Normal concentration.  Back  No CVAT,no abnormalities,  Full range of motion              PAST HISTORY        Primary Care Provider: No primary care provider on file.        PMH/PSH:    .     Past Medical History   Diagnosis Date   . Celiac disease        She has past surgical history that includes Tonsillectomy.      Social/Family History:      She reports that she has never smoked. She does not have any smokeless tobacco history on file. She reports that she does not drink alcohol or use illicit drugs.    History reviewed. No pertinent family history.      Listed Medications on Arrival:    .     Previous Medications    NORETHINDRONE-ETHINYL ESTRADIOL (JUNEL FE 1/20) 1-20 MG-MCG PER TABLET    Take 1 tablet by mouth daily.      Allergies: She is allergic to amoxicillin and penicillins.  VISIT INFORMATION        Clinical Course in the ED:            Medications Given in the ED:    .     ED Medication Orders     None            Procedures:      Procedures      Interpretations:                   RESULTS        Recent Lab Results:      Results     Procedure Component Value Units Date/Time    Urinalysis [[id number]]  (Abnormal) Collected:  12/19/13 0959    Specimen Information:  Urine / Urine, Clean Catch Updated:  12/19/13 1028     Urine Type Clean Catch      Color, UA Yellow      Clarity, UA Sl Cloudy (A)      Specific Gravity UA 1.020      Urine pH 7.0      Leukocyte Esterase, UA Large (A)      Nitrite, UA Negative      Protein, UR 100 (A)      Glucose, UA Negative      Ketones UA Trace (A)      Urobilinogen, UA 1.0 mg/dL      Bilirubin, UA Small (A)      Blood, UA Large (A)     Microscopic, Urine [[id number]]  (Abnormal) Collected:  12/19/13 0959     RBC, UA 26 - 50 (A) /hpf Updated:  12/19/13 1028     WBC, UA TNTC (A) /hpf      Squamous Epithelial Cells, Urine 6 - 10 /hpf     Urine HCG Qualitative [[id number]] Collected:  12/19/13 0959    Specimen Information:  Urine Updated:  12/19/13 1021     Urine HCG  Qualitative Negative               Radiology Results:      No orders to display               Scribe Attestation:      No scribe involved in the care of this patient                             Rob Bunting, MD  12/19/13 1028

## 2013-12-19 NOTE — Discharge Instructions (Signed)
Urinary Tract Infection    You have been diagnosed with a lower urinary tract infection (UTI). This is also called cystitis.    Cystitis is an infection in your bladder. Your doctor diagnosed it by testing your urine. Cystitis usually causes burning with urination or frequent urination. It might make you feel like you have to urinate even when you don't.     Cystitis is usually treated with antibiotics and medicine to help with pain.    It is VERY IMPORTANT that you fill your prescription and take all of the antibiotics as directed. If a lower urinary tract infection goes untreated for too long, it can become a kidney infection.    FOR WOMEN: To reduce the risk of getting cystitis again:   Always urinate before and after sexual intercourse.   Always wipe from front to back after urinating or having a bowel movement. Do not wipe from back to front.   Drink plenty of fluids. Try to drink cranberry or blueberry juice. These juices have a chemical that stops bacteria from "sticking" to the bladder.    YOU SHOULD SEEK MEDICAL ATTENTION IMMEDIATELY, EITHER HERE OR AT THE NEAREST EMERGENCY DEPARTMENT, IF ANY OF THE FOLLOWING OCCURS:   You have a fever (temperature higher than 100.4F / 38C) or shaking chills.   You feel nauseated or vomit.   You have pain in your side or back.   You don't get better after taking all of your antibiotics.   You have any new symptoms or concerns.   You feel worse or do not improve.

## 2014-08-26 ENCOUNTER — Encounter: Payer: Self-pay | Admitting: Women's Health

## 2014-08-26 ENCOUNTER — Ambulatory Visit (INDEPENDENT_AMBULATORY_CARE_PROVIDER_SITE_OTHER): Payer: 59 | Admitting: Women's Health

## 2014-08-26 ENCOUNTER — Other Ambulatory Visit (HOSPITAL_COMMUNITY)
Admission: RE | Admit: 2014-08-26 | Discharge: 2014-08-26 | Disposition: A | Discharge status: Home / Self Care | Payer: 59 | Source: Ambulatory Visit | Attending: Gynecology | Admitting: Gynecology

## 2014-08-26 VITALS — BP 120/74 | Ht 60.0 in | Wt 89.0 lb

## 2014-08-26 DIAGNOSIS — Z01419 Encounter for gynecological examination (general) (routine) without abnormal findings: Secondary | ICD-10-CM | POA: Insufficient documentation

## 2014-08-26 DIAGNOSIS — Z113 Encounter for screening for infections with a predominantly sexual mode of transmission: Secondary | ICD-10-CM

## 2014-08-26 DIAGNOSIS — N898 Other specified noninflammatory disorders of vagina: Secondary | ICD-10-CM | POA: Diagnosis not present

## 2014-08-26 DIAGNOSIS — Z3040 Encounter for surveillance of contraceptives, unspecified: Secondary | ICD-10-CM

## 2014-08-26 LAB — WET PREP FOR TRICH, YEAST, CLUE
CLUE CELLS WET PREP: NONE SEEN
TRICH WET PREP: NONE SEEN
Yeast Wet Prep HPF POC: NONE SEEN

## 2014-08-26 MED ORDER — NORETHINDRONE ACET-ETHINYL EST 1-20 MG-MCG PO TABS: 1.0000 | ORAL_TABLET | Freq: Every day | ORAL | Status: DC

## 2014-08-26 NOTE — Patient Instructions (Signed)

## 2014-08-26 NOTE — Progress Notes (Signed)
Arvie Villarruel Olmo 10/26/1990 [id number]    History:    Presents for annual exam.  Light monthly cycle on Loestrin. One partner for 1 year. Gardasil series completed. Celiac disease weight stable.  Past medical history, past surgical history, family history and social history were all reviewed and documented in the EPIC chart. 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 Both parents physicians.   ROS:  A ROS was performed and pertinent positives and negatives are included.  Exam:  Filed Vitals:   08/26/14 1036  BP: 120/74    General appearance:  Normal Thyroid:  Symmetrical, normal in size, without palpable masses or nodularity. Respiratory  Auscultation:  Clear without wheezing or rhonchi Cardiovascular  Auscultation:  Regular rate, without rubs, murmurs or gallops  Edema/varicosities:  Not grossly evident Abdominal  Soft,nontender, without masses, guarding or rebound.  Liver/spleen:  No organomegaly noted  Hernia:  None appreciated  Skin  Inspection:  Grossly normal   Breasts: Examined lying and sitting.     Right: Without masses, retractions, discharge or axillary adenopathy.     Left: Without masses, retractions, discharge or axillary adenopathy. Gentitourinary   Inguinal/mons:  Normal without inguinal adenopathy  External genitalia:  Normal  BUS/Urethra/Skene's glands:  Normal  Vagina:  Normal wet prep negative  Cervix:  Normal  Uterus:   normal in size, shape and contour.  Midline and mobile  Adnexa/parametria:     Rt: Without masses or tenderness.   Lt: Without masses or tenderness.  Anus and perineum: Normal    Assessment/Plan:  24 y.o. S WF G0 for annual exam with complaint of slight discharge  Monthly cycle on Loestrin STD screen Celiac disease   Plan: Loestrin 1/20 prescription, proper use given and reviewed slight risk for blood clots and strokes. Condoms encouraged until permanent partner. SBE's, exercise, calcium rich diet, MVI daily  encouraged. Campus safety reviewed. CBC, UA, Pap, GC/Chlamydia, HIV, hep B, C, RPRHuel Cote [facility], 5:34 PM 08/26/2014

## 2014-08-27 LAB — URINALYSIS W MICROSCOPIC + REFLEX CULTURE
BILIRUBIN URINE: NEGATIVE
Bacteria, UA: NONE SEEN [HPF]
CASTS: NONE SEEN [LPF]
CRYSTALS: NONE SEEN [HPF]
Glucose, UA: NEGATIVE
HGB URINE DIPSTICK: NEGATIVE
KETONES UR: NEGATIVE
LEUKOCYTES UA: NEGATIVE
Nitrite: NEGATIVE
PH: 5.5 (ref 5.0–8.0)
Protein, ur: NEGATIVE
Specific Gravity, Urine: 1.026 (ref 1.001–1.035)
Yeast: NONE SEEN [HPF]

## 2014-08-28 LAB — CYTOLOGY - PAP

## 2014-08-28 LAB — GC/CHLAMYDIA PROBE AMP
CT PROBE, AMP APTIMA: NEGATIVE
GC PROBE AMP APTIMA: NEGATIVE

## 2014-08-30 LAB — URINE CULTURE

## 2014-09-09 ENCOUNTER — Encounter (HOSPITAL_COMMUNITY): Payer: Self-pay | Admitting: Emergency Medicine

## 2014-09-09 ENCOUNTER — Emergency Department (HOSPITAL_COMMUNITY)
Admission: EM | Admit: 2014-09-09 | Discharge: 2014-09-09 | Disposition: A | Discharge status: Home / Self Care | Payer: 59 | Source: Home / Self Care | Attending: Emergency Medicine | Admitting: Emergency Medicine

## 2014-09-09 DIAGNOSIS — S91331A Puncture wound without foreign body, right foot, initial encounter: Secondary | ICD-10-CM

## 2014-09-09 MED ORDER — CEPHALEXIN 500 MG PO CAPS: 500.0000 mg | ORAL_CAPSULE | Freq: Four times a day (QID) | ORAL | Status: DC

## 2014-09-09 MED ORDER — TETANUS-DIPHTH-ACELL PERTUSSIS 5-2.5-18.5 LF-MCG/0.5 IM SUSP: 0.5000 mL | Freq: Once | INTRAMUSCULAR | Status: DC

## 2014-09-09 NOTE — Discharge Instructions (Signed)
Your last tetanus shot was in 2013, so you are covered. Please take Keflex one pill 4 times a day for 7 days to prevent infection. Do warm soaks 3 times a day for the next 2-3 days to help prevent infection. Follow-up as needed.

## 2014-09-09 NOTE — ED Notes (Addendum)
Pt stepped on a rusty nail yesterday on her right heel.  The wound was cleaned and covered at home.  Pt denies any issues other than discomfort when walking.  Pt is not sure when she had her last Tetanus shot.

## 2014-09-09 NOTE — ED Provider Notes (Signed)
CSN: [id number]     Arrival date & time 09/09/14  1319 History   First MD Initiated Contact with Patient 09/09/14 1425     Chief Complaint  Patient presents with  . Puncture Wound   (Consider location/radiation/quality/duration/timing/severity/associated sxs/prior Treatment) HPI  She is a 24 year old woman here for evaluation of right heel injury. She states she was walking the dog yesterday when she stepped on a rusty nail. It went through her sandal and into the right heel. She states her dad is a doctor and he soaked it in Betadine and bandaged it yesterday. She states it is a little sore and swollen today. She thinks her last tetanus was within the last 5 years.  Past Medical History  Diagnosis Date  . Celiac sprue   . Recurrent tonsillitis 12/2011  . Amoxicillin rash 12/30/2011   Past Surgical History  Procedure Laterality Date  . Tonsillectomy  11/26/2010    Procedure: TONSILLECTOMY;  Surgeon: Tyson Alias;  Location: [place] [facility];  Service: ENT;  Laterality: Right;  I & D right peritonsillar abscess  . Incision and drainage of peritonsillar abcess  11/26/2010    was not a tonsillectomy  . Biopsy bowel  age 54 mos. and age 12  . Tonsillectomy  01/03/2012    Procedure: TONSILLECTOMY;  Surgeon: Jodi Marble, MD;  Location: [facility];  Service: ENT;  Laterality: N/A;  . Adenoidectomy  01/03/2012    Procedure: ADENOIDECTOMY;  Surgeon: Jodi Marble, MD;  Location: [facility];  Service: ENT;  Laterality: N/A;   Family History  Problem Relation Age of Onset  . Cancer Paternal Grandfather     pancreatic   Social History  Substance Use Topics  . Smoking status: Former Smoker    Types: Pipe  . Smokeless tobacco: Never Used     Comment: Vape  . Alcohol Use: No   OB History    Gravida Para Term Preterm AB TAB SAB Ectopic Multiple Living   0 0 0 0 0 0 0 0 0 0      Review of Systems As in history of present illness Allergies   Gluten meal; Penicillins; and Amoxicillin  Home Medications   Prior to Admission medications   Medication Sig Start Date End Date Taking? Authorizing Provider  norethindrone-ethinyl estradiol (MICROGESTIN,JUNEL,LOESTRIN) 1-20 MG-MCG tablet Take 1 tablet by mouth daily. 08/26/14  Yes [name], NP  cephALEXin (KEFLEX) 500 MG capsule Take 1 capsule (500 mg total) by mouth 4 (four) times daily. 09/09/14   Melony Overly, MD   BP 127/85 mmHg  Pulse 92  Temp(Src) 99.2 F (37.3 C) (Oral)  Resp 16  SpO2 98%  LMP 08/06/2014 (Exact Date) Physical Exam  Constitutional: She is oriented to person, place, and time. She appears well-developed and well-nourished. No distress.  Cardiovascular: Normal rate.   Pulmonary/Chest: Effort normal.  Neurological: She is alert and oriented to person, place, and time.  Skin:  5 mm puncture wound to right heel. There is slight swelling and erythema.    ED Course  Procedures (including critical care time) Labs Review Labs Reviewed - No data to display  Imaging Review No results found.   MDM   1. Puncture wound of heel, right, initial encounter    Chart review shows tetanus in 2013. Keflex given to prevent infection. Wound care discussed.    Melony Overly, MD 09/09/14 [phone]

## 2014-09-10 ENCOUNTER — Encounter: Payer: Self-pay | Admitting: Women's Health

## 2014-09-10 ENCOUNTER — Other Ambulatory Visit: Payer: 59

## 2014-09-10 LAB — CBC WITH DIFFERENTIAL/PLATELET
BASOS ABS: 0 10*3/uL (ref 0.0–0.1)
Basophils Relative: 0 % (ref 0–1)
EOS ABS: 0.4 10*3/uL (ref 0.0–0.7)
Eosinophils Relative: 8 % — ABNORMAL HIGH (ref 0–5)
HEMATOCRIT: 40.9 % (ref 36.0–46.0)
HEMOGLOBIN: 13.9 g/dL (ref 12.0–15.0)
LYMPHS ABS: 2.1 10*3/uL (ref 0.7–4.0)
LYMPHS PCT: 40 % (ref 12–46)
MCH: 30.3 pg (ref 26.0–34.0)
MCHC: 34 g/dL (ref 30.0–36.0)
MCV: 89.1 fL (ref 78.0–100.0)
MONOS PCT: 9 % (ref 3–12)
MPV: 9.1 fL (ref 8.6–12.4)
Monocytes Absolute: 0.5 10*3/uL (ref 0.1–1.0)
NEUTROS ABS: 2.3 10*3/uL (ref 1.7–7.7)
NEUTROS PCT: 43 % (ref 43–77)
PLATELETS: 285 10*3/uL (ref 150–400)
RBC: 4.59 MIL/uL (ref 3.87–5.11)
RDW: 13.1 % (ref 11.5–15.5)
WBC: 5.3 10*3/uL (ref 4.0–10.5)

## 2014-09-11 ENCOUNTER — Encounter: Payer: Self-pay | Admitting: Women's Health

## 2014-09-11 LAB — RPR

## 2014-09-11 LAB — HEPATITIS C ANTIBODY: HCV Ab: NEGATIVE

## 2014-09-11 LAB — HEPATITIS B SURFACE ANTIGEN: Hepatitis B Surface Ag: NEGATIVE

## 2014-09-11 LAB — HIV ANTIBODY (ROUTINE TESTING W REFLEX): HIV: NONREACTIVE

## 2015-08-17 DIAGNOSIS — M7989 Other specified soft tissue disorders: Secondary | ICD-10-CM | POA: Diagnosis not present

## 2015-08-17 DIAGNOSIS — S52591A Other fractures of lower end of right radius, initial encounter for closed fracture: Secondary | ICD-10-CM | POA: Diagnosis not present

## 2015-08-17 DIAGNOSIS — M25531 Pain in right wrist: Secondary | ICD-10-CM | POA: Diagnosis not present

## 2015-08-17 DIAGNOSIS — Y92828 Other wilderness area as the place of occurrence of the external cause: Secondary | ICD-10-CM | POA: Diagnosis not present

## 2015-08-18 DIAGNOSIS — S52514A Nondisplaced fracture of right radial styloid process, initial encounter for closed fracture: Secondary | ICD-10-CM | POA: Diagnosis not present

## 2015-08-27 ENCOUNTER — Encounter: Payer: 59 | Admitting: Women's Health

## 2015-09-08 ENCOUNTER — Emergency Department (HOSPITAL_COMMUNITY): Payer: 59

## 2015-09-08 ENCOUNTER — Emergency Department (HOSPITAL_COMMUNITY)
Admission: EM | Admit: 2015-09-08 | Discharge: 2015-09-08 | Disposition: A | Discharge status: Home / Self Care | Payer: 59 | Attending: Emergency Medicine | Admitting: Emergency Medicine

## 2015-09-08 ENCOUNTER — Encounter (HOSPITAL_COMMUNITY): Payer: Self-pay | Admitting: Emergency Medicine

## 2015-09-08 DIAGNOSIS — Z87891 Personal history of nicotine dependence: Secondary | ICD-10-CM | POA: Insufficient documentation

## 2015-09-08 DIAGNOSIS — T887XXA Unspecified adverse effect of drug or medicament, initial encounter: Secondary | ICD-10-CM | POA: Diagnosis not present

## 2015-09-08 DIAGNOSIS — F419 Anxiety disorder, unspecified: Secondary | ICD-10-CM | POA: Insufficient documentation

## 2015-09-08 DIAGNOSIS — T50905A Adverse effect of unspecified drugs, medicaments and biological substances, initial encounter: Secondary | ICD-10-CM | POA: Insufficient documentation

## 2015-09-08 DIAGNOSIS — Z5181 Encounter for therapeutic drug level monitoring: Secondary | ICD-10-CM | POA: Insufficient documentation

## 2015-09-08 DIAGNOSIS — E876 Hypokalemia: Secondary | ICD-10-CM | POA: Insufficient documentation

## 2015-09-08 DIAGNOSIS — R000 Tachycardia, unspecified: Secondary | ICD-10-CM | POA: Insufficient documentation

## 2015-09-08 DIAGNOSIS — Y829 Unspecified medical devices associated with adverse incidents: Secondary | ICD-10-CM | POA: Diagnosis not present

## 2015-09-08 LAB — COMPREHENSIVE METABOLIC PANEL
ALBUMIN: 4.3 g/dL (ref 3.5–5.0)
ALK PHOS: 54 U/L (ref 38–126)
ALT: 15 U/L (ref 14–54)
ANION GAP: 12 (ref 5–15)
AST: 24 U/L (ref 15–41)
BUN: 12 mg/dL (ref 6–20)
CALCIUM: 9.2 mg/dL (ref 8.9–10.3)
CO2: 19 mmol/L — ABNORMAL LOW (ref 22–32)
Chloride: 105 mmol/L (ref 101–111)
Creatinine, Ser: 0.81 mg/dL (ref 0.44–1.00)
GFR calc Af Amer: >60 mL/min (ref >60)
GLUCOSE: 126 mg/dL — AB (ref 65–99)
Potassium: 3.1 mmol/L — ABNORMAL LOW (ref 3.5–5.1)
SODIUM: 136 mmol/L (ref 135–145)
TOTAL PROTEIN: 6.7 g/dL (ref 6.5–8.1)
Total Bilirubin: 0.6 mg/dL (ref 0.3–1.2)

## 2015-09-08 LAB — CBC WITH DIFFERENTIAL/PLATELET
BASOS PCT: 0 %
Basophils Absolute: 0 10*3/uL (ref 0.0–0.1)
EOS ABS: 0.6 10*3/uL (ref 0.0–0.7)
EOS PCT: 6 %
HCT: 41 % (ref 36.0–46.0)
HEMOGLOBIN: 14.8 g/dL (ref 12.0–15.0)
LYMPHS ABS: 3.4 10*3/uL (ref 0.7–4.0)
Lymphocytes Relative: 34 %
MCH: 30.7 pg (ref 26.0–34.0)
MCHC: 36.1 g/dL — AB (ref 30.0–36.0)
MCV: 85.1 fL (ref 78.0–100.0)
MONO ABS: 0.6 10*3/uL (ref 0.1–1.0)
MONOS PCT: 6 %
NEUTROS PCT: 54 %
Neutro Abs: 5.2 10*3/uL (ref 1.7–7.7)
PLATELETS: 263 10*3/uL (ref 150–400)
RBC: 4.82 MIL/uL (ref 3.87–5.11)
RDW: 11.8 % (ref 11.5–15.5)
WBC: 9.8 10*3/uL (ref 4.0–10.5)

## 2015-09-08 LAB — RAPID URINE DRUG SCREEN, HOSP PERFORMED
AMPHETAMINES: NOT DETECTED
Barbiturates: NOT DETECTED
Benzodiazepines: NOT DETECTED
Cocaine: NOT DETECTED
Opiates: NOT DETECTED
TETRAHYDROCANNABINOL: NOT DETECTED

## 2015-09-08 LAB — I-STAT BETA HCG BLOOD, ED (MC, WL, AP ONLY)

## 2015-09-08 LAB — D-DIMER, QUANTITATIVE (NOT AT ARMC)

## 2015-09-08 MED ORDER — SODIUM CHLORIDE 0.9 % IV BOLUS (SEPSIS)
500.0000 mL | Freq: Once | INTRAVENOUS | Status: AC
  Administered 2015-09-08: 500 mL via INTRAVENOUS

## 2015-09-08 MED ORDER — POTASSIUM CHLORIDE CRYS ER 20 MEQ PO TBCR
40.0000 meq | EXTENDED_RELEASE_TABLET | Freq: Once | ORAL | Status: AC
  Administered 2015-09-08: 40 meq via ORAL
  Filled 2015-09-08: qty 2

## 2015-09-08 NOTE — ED Provider Notes (Signed)
[facility] DEPT Provider Note   CSN: [id number] Arrival date & time: 09/08/15  0017  By signing my name below, I, Beth Hayes, attest that this documentation has been prepared under the direction and in the presence of Lashaunta Sicard, MD. Electronically Signed: Tedra Coupe. Sheppard Coil, ED Scribe. 09/08/15. 12:55 AM.  History   Chief Complaint Chief Complaint  Patient presents with  . Tachycardia    HPI HPI Comments: Beth Hayes is a 25 y.o. female who presents to the Emergency Department complaining of sudden onset, constant, tachycardia that began 32mn PTA. Per pt's boyfriend, she was watching television when symptoms began. She has associated tremors. Pt reports use of a vape 232m prior to symptoms starting. She denies any use of marijuana, cocaine or methamphetamines. [place]. She does not take any daily medications for depression or anxiety, but takes birth control. Denies any EtOH consumption or appetite suppressants. She states that she consumed caffeine early Sunday morning (09/07/15). No alleviating factors noted. Denies leg pain, cough, rash, congestion, nausea, or vomiting. Pt's boyfriend reports that she was bitten by fleas earlier today on 09/07/15.  No CP no SOb no DOE.  No trauma.    The history is provided by the patient and the spouse. No language interpreter was used.    Past Medical History:  Diagnosis Date  . Amoxicillin rash 12/30/2011  . Celiac sprue   . Recurrent tonsillitis 12/2011    Patient Active Problem List   Diagnosis Date Noted  . Abscess, peritonsillar 11/26/2010    Class: Acute  . Celiac sprue     Past Surgical History:  Procedure Laterality Date  . ADENOIDECTOMY  01/03/2012   Procedure: ADENOIDECTOMY;  Surgeon: KaJodi MarbleMD;  [place] Service: E[place];  Laterality: N/A;  . BIOPSY BOWEL  age 25 mosand age 25 . INCISION AND DRAINAGE OF PERITONSILLAR ABCESS  11/26/2010   was not a tonsillectomy  . TONSILLECTOMY  11/26/2010   Procedure: TONSILLECTOMY;  Surgeon: KaTyson Alias [place] Service: [place];  Laterality: Right;  I & D right peritonsillar abscess  . TONSILLECTOMY  01/03/2012   Procedure: TONSILLECTOMY;  Surgeon: KaJodi MarbleMD;  [place] Service: [place];  Laterality: N/A;    OB History    Gravida Para Term Preterm AB Living   0 0 0 0 0 0   SAB TAB Ectopic Multiple Live Births   0 0 0 0         Home Medications    Prior to Admission medications   Medication Sig Start Date End Date Taking? Authorizing Provider  cephALEXin (KEFLEX) 500 MG capsule Take 1 capsule (500 mg total) by mouth 4 (four) times daily. 09/09/14   ErMelony OverlyMD  norethindrone-ethinyl estradiol (MICROGESTIN,JUNEL,LOESTRIN) 1-20 MG-MCG tablet Take 1 tablet by mouth daily. 08/26/14   NaHuel CoteNP    Family History Family History  Problem Relation Age of Onset  . Cancer Paternal Grandfather     pancreatic    Social History Social History  Substance Use Topics  . Smoking status: Former Smoker    Types: Pipe  . Smokeless tobacco: Never Used     Comment: Vape  . Alcohol use No     Allergies   Gluten meal; Penicillins; and Amoxicillin   Review of Systems Review of  Systems  Constitutional: Negative for diaphoresis.  HENT: Negative for congestion.   Respiratory: Negative for cough and chest tightness.   Cardiovascular: Negative for chest pain and leg swelling.       Tachycardia  Gastrointestinal: Negative for abdominal pain, nausea and vomiting.  Skin: Negative for rash.  Neurological: Positive for tremors.  Psychiatric/Behavioral: Negative for hallucinations and suicidal ideas.  All other systems reviewed and are negative.  Physical Exam Updated Vital Signs BP 156/95 (BP Location: Left Arm)   Pulse (!) 141   Resp 19   Ht 4' 11"   (1.499 m)   Wt 95 lb (43.1 kg)   SpO2 100%   BMI 19.19 kg/m   Physical Exam  Constitutional: She is oriented to person, place, and time. She appears well-developed and well-nourished. No distress.  HENT:  Head: Normocephalic and atraumatic.  Mouth/Throat: Oropharynx is clear and moist. No oropharyngeal exudate.  Moist mucous membranes   Eyes: Conjunctivae are normal. Pupils are equal, round, and reactive to light.  Neck: No JVD present.  Trachea midline No bruit  Cardiovascular: Regular rhythm and normal heart sounds.   Regular tachycardia  Pulmonary/Chest: Breath sounds normal. No stridor. Tachypnea noted. No respiratory distress.  Abdominal: Soft. Bowel sounds are normal. She exhibits no distension and no mass. There is no tenderness. There is no rebound and no guarding.  Musculoskeletal:  All compartments soft. No cords.  Neurological: She is alert and oriented to person, place, and time. She has normal reflexes.  Skin: Skin is warm and dry.  Psychiatric: She has a normal mood and affect. Her behavior is normal.  Nursing note and vitals reviewed.   ED Treatments / Results  DIAGNOSTIC STUDIES: Oxygen Saturation is 100% on RA, normal by my interpretation.    COORDINATION OF CARE: 12:31 AM-Discussed treatment plan which includes CBC, CMP, D-Dimer, IV Fluids, and Drug Screen with pt at bedside and pt agreed to plan.   Labs (all labs ordered are listed, but only abnormal results are displayed) Labs Reviewed  CBC WITH DIFFERENTIAL/PLATELET  COMPREHENSIVE METABOLIC PANEL  D-DIMER, QUANTITATIVE (NOT AT [facility])  URINE RAPID DRUG SCREEN, HOSP PERFORMED  I-STAT BETA HCG BLOOD, ED ([facility], [facility], [facility] ONLY)   EKG  EKG Interpretation None       Radiology No results found.  Procedures Procedures (including critical care time)  Medications Ordered in ED Medications  sodium chloride 0.9 % bolus 500 mL (500 mLs Intravenous New Bag/Given 09/08/15 0040)   Initial Impression /  Assessment and Plan / ED Course  I have reviewed the triage vital signs and the nursing notes.  Pertinent labs & imaging results that were available during my care of the patient were reviewed by me and considered in my medical decision making (see chart for details).  Clinical Course    Vitals:   09/08/15 0030 09/08/15 0200  BP: 156/95 117/79  Pulse: (!) 141 85  Resp: 19 20  Temp: 98.5 F (36.9 C)    Results for orders placed or performed during the hospital encounter of 09/08/15  CBC with Differential/Platelet  Result Value Ref Range   WBC 9.8 4.0 - 10.5 K/uL   RBC 4.82 3.87 - 5.11 MIL/uL   Hemoglobin 14.8 12.0 - 15.0 g/dL   HCT 41.0 36.0 - 46.0 %   MCV 85.1 78.0 - 100.0 fL   MCH 30.7 26.0 - 34.0 pg   MCHC 36.1 (H) 30.0 - 36.0 g/dL   RDW 11.8 11.5 - 15.5 %   Platelets  263 150 - 400 K/uL   Neutrophils Relative % 54 %   Neutro Abs 5.2 1.7 - 7.7 K/uL   Lymphocytes Relative 34 %   Lymphs Abs 3.4 0.7 - 4.0 K/uL   Monocytes Relative 6 %   Monocytes Absolute 0.6 0.1 - 1.0 K/uL   Eosinophils Relative 6 %   Eosinophils Absolute 0.6 0.0 - 0.7 K/uL   Basophils Relative 0 %   Basophils Absolute 0.0 0.0 - 0.1 K/uL  Comprehensive metabolic panel  Result Value Ref Range   Sodium 136 135 - 145 mmol/L   Potassium 3.1 (L) 3.5 - 5.1 mmol/L   Chloride 105 101 - 111 mmol/L   CO2 19 (L) 22 - 32 mmol/L   Glucose, Bld 126 (H) 65 - 99 mg/dL   BUN 12 6 - 20 mg/dL   Creatinine, Ser 0.81 0.44 - 1.00 mg/dL   Calcium 9.2 8.9 - 10.3 mg/dL   Total Protein 6.7 6.5 - 8.1 g/dL   Albumin 4.3 3.5 - 5.0 g/dL   AST 24 15 - 41 U/L   ALT 15 14 - 54 U/L   Alkaline Phosphatase 54 38 - 126 U/L   Total Bilirubin 0.6 0.3 - 1.2 mg/dL   GFR calc non Af Amer >60 >60 mL/min   GFR calc Af Amer >60 >60 mL/min   Anion gap 12 5 - 15  D-dimer, quantitative (not at [facility])  Result Value Ref Range   D-Dimer, Quant <0.27 0.00 - 0.50 ug/mL-FEU  Urine rapid drug screen (hosp performed)  Result Value Ref Range    Opiates NONE DETECTED NONE DETECTED   Cocaine NONE DETECTED NONE DETECTED   Benzodiazepines NONE DETECTED NONE DETECTED   Amphetamines NONE DETECTED NONE DETECTED   Tetrahydrocannabinol NONE DETECTED NONE DETECTED   Barbiturates NONE DETECTED NONE DETECTED  I-Stat Beta hCG blood, ED ([facility], [facility], [facility] only)  Result Value Ref Range   I-stat hCG, quantitative <5.0 <5 mIU/mL   Comment 3           Dg Chest 2 View  Result Date: 09/08/2015 CLINICAL DATA:  Tachycardia. EXAM: CHEST  2 VIEW COMPARISON:  None. FINDINGS: Normal heart size and mediastinal contours. No infiltrate or edema. No effusion or pneumothorax. No osseous findings. IMPRESSION: Negative chest. Electronically Signed   By: Monte Fantasia M.D.   On: 09/08/2015 01:51   Medications  sodium chloride 0.9 % bolus 500 mL (0 mLs Intravenous Stopped 09/08/15 0147)  potassium chloride SA (K-DUR,KLOR-CON) CR tablet 40 mEq (40 mEq Oral Given 09/08/15 0146)     Final Clinical Impressions(s) / ED Diagnoses   Final diagnoses:  None    New Prescriptions New Prescriptions   No medications on file  No vaping. Reduce caffeine.  Follow up with your PMD.  All questions answered to patient's satisfaction. Based on history and exam patient has been appropriately medically screened and emergency conditions excluded. Patient is stable for discharge at this time. Follow up with your PMDfor recheck in 2 daysand strict return precautions given. I personally performed the services described in this documentation, which was scribed in my presence. The recorded information has been reviewed and is accurate.       Veatrice Kells, MD 09/08/15 [phone]

## 2015-09-08 NOTE — ED Notes (Signed)
Bed: WA17 Expected date:  Expected time:  Means of arrival:  Comments:

## 2015-09-08 NOTE — ED Triage Notes (Signed)
Pt reports sudden onset of rapid heart rate that started appx 20 min ago. PT reports sitting at home watching TV when episode started. Pt reported use of vaping around 54mn prior to episode started.

## 2015-09-10 ENCOUNTER — Ambulatory Visit (INDEPENDENT_AMBULATORY_CARE_PROVIDER_SITE_OTHER): Payer: 59 | Admitting: Women's Health

## 2015-09-10 ENCOUNTER — Encounter: Payer: Self-pay | Admitting: Women's Health

## 2015-09-10 VITALS — BP 112/6 | Ht 60.0 in | Wt 86.0 lb

## 2015-09-10 DIAGNOSIS — R739 Hyperglycemia, unspecified: Secondary | ICD-10-CM

## 2015-09-10 DIAGNOSIS — R7309 Other abnormal glucose: Secondary | ICD-10-CM | POA: Diagnosis not present

## 2015-09-10 DIAGNOSIS — Z01419 Encounter for gynecological examination (general) (routine) without abnormal findings: Secondary | ICD-10-CM | POA: Diagnosis not present

## 2015-09-10 DIAGNOSIS — Z3040 Encounter for surveillance of contraceptives, unspecified: Secondary | ICD-10-CM | POA: Diagnosis not present

## 2015-09-10 MED ORDER — NORETHINDRONE ACET-ETHINYL EST 1-20 MG-MCG PO TABS: 1.0000 | ORAL_TABLET | Freq: Every day | ORAL | 4 refills | Status: DC

## 2015-09-10 NOTE — Patient Instructions (Signed)
Health Maintenance, Female Adopting a healthy lifestyle and getting preventive care can go a long way to promote health and wellness. Talk with your health care provider about what schedule of regular examinations is right for you. This is a good chance for you to check in with your provider about disease prevention and staying healthy. In between checkups, there are plenty of things you can do on your own. Experts have done a lot of research about which lifestyle changes and preventive measures are most likely to keep you healthy. Ask your health care provider for more information. WEIGHT AND DIET  Eat a healthy diet  Be sure to include plenty of vegetables, fruits, low-fat dairy products, and lean protein.  Do not eat a lot of foods high in solid fats, added sugars, or salt.  Get regular exercise. This is one of the most important things you can do for your health.  Most adults should exercise for at least 150 minutes each week. The exercise should increase your heart rate and make you sweat (moderate-intensity exercise).  Most adults should also do strengthening exercises at least twice a week. This is in addition to the moderate-intensity exercise.  Maintain a healthy weight  Body mass index (BMI) is a measurement that can be used to identify possible weight problems. It estimates body fat based on height and weight. Your health care provider can help determine your BMI and help you achieve or maintain a healthy weight.  For females 20 years of age and older:   A BMI below 18.5 is considered underweight.  A BMI of 18.5 to 24.9 is normal.  A BMI of 25 to 29.9 is considered overweight.  A BMI of 30 and above is considered obese.  Watch levels of cholesterol and blood lipids  You should start having your blood tested for lipids and cholesterol at 23 years of age, then have this test every 5 years.  You may need to have your cholesterol levels checked more often if:  Your lipid  or cholesterol levels are high.  You are older than 23 years of age.  You are at high risk for heart disease.  CANCER SCREENING   Lung Cancer  Lung cancer screening is recommended for adults 55-80 years old who are at high risk for lung cancer because of a history of smoking.  A yearly low-dose CT scan of the lungs is recommended for people who:  Currently smoke.  Have quit within the past 15 years.  Have at least a 30-pack-year history of smoking. A pack year is smoking an average of one pack of cigarettes a day for 1 year.  Yearly screening should continue until it has been 15 years since you quit.  Yearly screening should stop if you develop a health problem that would prevent you from having lung cancer treatment.  Breast Cancer  Practice breast self-awareness. This means understanding how your breasts normally appear and feel.  It also means doing regular breast self-exams. Let your health care provider know about any changes, no matter how small.  If you are in your 20s or 30s, you should have a clinical breast exam (CBE) by a health care provider every 1-3 years as part of a regular health exam.  If you are 40 or older, have a CBE every year. Also consider having a breast X-ray (mammogram) every year.  If you have a family history of breast cancer, talk to your health care provider about genetic screening.  If you   are at high risk for breast cancer, talk to your health care provider about having an MRI and a mammogram every year.  Breast cancer gene (BRCA) assessment is recommended for women who have family members with BRCA-related cancers. BRCA-related cancers include:  Breast.  Ovarian.  Tubal.  Peritoneal cancers.  Results of the assessment will determine the need for genetic counseling and BRCA1 and BRCA2 testing. Cervical Cancer Your health care provider may recommend that you be screened regularly for cancer of the pelvic organs (ovaries, uterus, and  vagina). This screening involves a pelvic examination, including checking for microscopic changes to the surface of your cervix (Pap test). You may be encouraged to have this screening done every 3 years, beginning at age 21.  For women ages 30-65, health care providers may recommend pelvic exams and Pap testing every 3 years, or they may recommend the Pap and pelvic exam, combined with testing for human papilloma virus (HPV), every 5 years. Some types of HPV increase your risk of cervical cancer. Testing for HPV may also be done on women of any age with unclear Pap test results.  Other health care providers may not recommend any screening for nonpregnant women who are considered low risk for pelvic cancer and who do not have symptoms. Ask your health care provider if a screening pelvic exam is right for you.  If you have had past treatment for cervical cancer or a condition that could lead to cancer, you need Pap tests and screening for cancer for at least 20 years after your treatment. If Pap tests have been discontinued, your risk factors (such as having a new sexual partner) need to be reassessed to determine if screening should resume. Some women have medical problems that increase the chance of getting cervical cancer. In these cases, your health care provider may recommend more frequent screening and Pap tests. Colorectal Cancer  This type of cancer can be detected and often prevented.  Routine colorectal cancer screening usually begins at 23 years of age and continues through 23 years of age.  Your health care provider may recommend screening at an earlier age if you have risk factors for colon cancer.  Your health care provider may also recommend using home test kits to check for hidden blood in the stool.  A small camera at the end of a tube can be used to examine your colon directly (sigmoidoscopy or colonoscopy). This is done to check for the earliest forms of colorectal  cancer.  Routine screening usually begins at age 50.  Direct examination of the colon should be repeated every 5-10 years through 23 years of age. However, you may need to be screened more often if early forms of precancerous polyps or small growths are found. Skin Cancer  Check your skin from head to toe regularly.  Tell your health care provider about any new moles or changes in moles, especially if there is a change in a mole's shape or color.  Also tell your health care provider if you have a mole that is larger than the size of a pencil eraser.  Always use sunscreen. Apply sunscreen liberally and repeatedly throughout the day.  Protect yourself by wearing long sleeves, pants, a wide-brimmed hat, and sunglasses whenever you are outside. HEART DISEASE, DIABETES, AND HIGH BLOOD PRESSURE   High blood pressure causes heart disease and increases the risk of stroke. High blood pressure is more likely to develop in:  People who have blood pressure in the high end   of the normal range (130-139/85-89 mm Hg).  People who are overweight or obese.  People who are African American.  If you are 38-23 years of age, have your blood pressure checked every 3-5 years. If you are 61 years of age or older, have your blood pressure checked every year. You should have your blood pressure measured twice--once when you are at a hospital or clinic, and once when you are not at a hospital or clinic. Record the average of the two measurements. To check your blood pressure when you are not at a hospital or clinic, you can use:  An automated blood pressure machine at a pharmacy.  A home blood pressure monitor.  If you are between 45 years and 39 years old, ask your health care provider if you should take aspirin to prevent strokes.  Have regular diabetes screenings. This involves taking a blood sample to check your fasting blood sugar level.  If you are at a normal weight and have a low risk for diabetes,  have this test once every three years after 23 years of age.  If you are overweight and have a high risk for diabetes, consider being tested at a younger age or more often. PREVENTING INFECTION  Hepatitis B  If you have a higher risk for hepatitis B, you should be screened for this virus. You are considered at high risk for hepatitis B if:  You were born in a country where hepatitis B is common. Ask your health care provider which countries are considered high risk.  Your parents were born in a high-risk country, and you have not been immunized against hepatitis B (hepatitis B vaccine).  You have HIV or AIDS.  You use needles to inject street drugs.  You live with someone who has hepatitis B.  You have had sex with someone who has hepatitis B.  You get hemodialysis treatment.  You take certain medicines for conditions, including cancer, organ transplantation, and autoimmune conditions. Hepatitis C  Blood testing is recommended for:  Everyone born from [birth year] through 1963.  Anyone with known risk factors for hepatitis C. Sexually transmitted infections (STIs)  You should be screened for sexually transmitted infections (STIs) including gonorrhea and chlamydia if:  You are sexually active and are younger than 24 years of age.  You are older than 23 years of age and your health care provider tells you that you are at risk for this type of infection.  Your sexual activity has changed since you were last screened and you are at an increased risk for chlamydia or gonorrhea. Ask your health care provider if you are at risk.  If you do not have HIV, but are at risk, it may be recommended that you take a prescription medicine daily to prevent HIV infection. This is called pre-exposure prophylaxis (PrEP). You are considered at risk if:  You are sexually active and do not regularly use condoms or know the HIV status of your partner(s).  You take drugs by injection.  You are sexually  active with a partner who has HIV. Talk with your health care provider about whether you are at high risk of being infected with HIV. If you choose to begin PrEP, you should first be tested for HIV. You should then be tested every 3 months for as long as you are taking PrEP.  PREGNANCY   If you are premenopausal and you may become pregnant, ask your health care provider about preconception counseling.  If you may  become pregnant, take 400 to 800 micrograms (mcg) of folic acid every day.  If you want to prevent pregnancy, talk to your health care provider about birth control (contraception). OSTEOPOROSIS AND MENOPAUSE   Osteoporosis is a disease in which the bones lose minerals and strength with aging. This can result in serious bone fractures. Your risk for osteoporosis can be identified using a bone density scan.  If you are 61 years of age or older, or if you are at risk for osteoporosis and fractures, ask your health care provider if you should be screened.  Ask your health care provider whether you should take a calcium or vitamin D supplement to lower your risk for osteoporosis.  Menopause may have certain physical symptoms and risks.  Hormone replacement therapy may reduce some of these symptoms and risks. Talk to your health care provider about whether hormone replacement therapy is right for you.  HOME CARE INSTRUCTIONS   Schedule regular health, dental, and eye exams.  Stay current with your immunizations.   Do not use any tobacco products including cigarettes, chewing tobacco, or electronic cigarettes.  If you are pregnant, do not drink alcohol.  If you are breastfeeding, limit how much and how often you drink alcohol.  Limit alcohol intake to no more than 1 drink per day for nonpregnant women. One drink equals 12 ounces of beer, 5 ounces of wine, or 1 ounces of hard liquor.  Do not use street drugs.  Do not share needles.  Ask your health care provider for help if  you need support or information about quitting drugs.  Tell your health care provider if you often feel depressed.  Tell your health care provider if you have ever been abused or do not feel safe at home.   This information is not intended to replace advice given to you by your health care provider. Make sure you discuss any questions you have with your health care provider.   Document Released: 07/20/2010 Document Revised: 01/25/2014 Document Reviewed: 12/06/2012 Elsevier Interactive Patient Education [employer].

## 2015-09-10 NOTE — Progress Notes (Signed)
Beth Hayes [birth year] [id number]    History:    Presents for annual exam.  Monthly cycle on Loestrin. Celiac disease. Has always been slim mother also slim. Gardasil series completed. Normal Pap history negative STD screen with current partner.  Past medical history, past surgical history, family history and social history were all reviewed and documented in the EPIC chart. Recently graduated from college contemplating [employer] school. Both parents are physicians.  ROS:  A ROS was performed and pertinent positives and negatives are included.  Exam:  Vitals:   09/10/15 1528  BP: (!) 112/6  Weight: 86 lb (39 kg)  Height: 5' (1.524 m)   Body mass index is 16.8 kg/m.   General appearance:  Normal Thyroid:  Symmetrical, normal in size, without palpable masses or nodularity. Respiratory  Auscultation:  Clear without wheezing or rhonchi Cardiovascular  Auscultation:  Regular rate, without rubs, murmurs or gallops  Edema/varicosities:  Not grossly evident Abdominal  Soft,nontender, without masses, guarding or rebound.  Liver/spleen:  No organomegaly noted  Hernia:  None appreciated  Skin  Inspection:  Grossly normal   Breasts: Examined lying and sitting.     Right: Without masses, retractions, discharge or axillary adenopathy.     Left: Without masses, retractions, discharge or axillary adenopathy. Gentitourinary   Inguinal/mons:  Normal without inguinal adenopathy  External genitalia:  Normal  BUS/Urethra/Skene's glands:  Normal  Vagina:  Normal  Cervix:  Normal  Uterus:  normal in size, shape and contour.  Midline and mobile  Adnexa/parametria:     Rt: Without masses or tenderness.   Lt: Without masses or tenderness.  Anus and perineum: Normal  Assessment/Plan:  25 y.o. S WF0  for annual exam.    Monthly cycle on Loestrin Contraception management Celiac disease Underweight  Plan: Contraception options reviewed would like to have Nexplanon, information given and  reviewed slight risk for spotting first couple of months on Nexplanon. Will continue Loestrin will check coverage and have Dr. Toney Rakes place with next cycle. SBE's, exercise, calcium rich diet, MVI daily encouraged. Campus safety reviewed. CMP, hemoglobin A1c, TSH, has history of elevated blood sugar and normal CBC last week when seen for questionable panic attack. Pap normal 2016, new screening guidelines reviewed. Healthy diet reviewed.  [place], 5:48 PM 09/10/2015

## 2015-09-11 ENCOUNTER — Telehealth: Payer: Self-pay

## 2015-09-11 DIAGNOSIS — M25531 Pain in right wrist: Secondary | ICD-10-CM | POA: Diagnosis not present

## 2015-09-11 LAB — URINALYSIS W MICROSCOPIC + REFLEX CULTURE
Bacteria, UA: NONE SEEN [HPF]
Bilirubin Urine: NEGATIVE
CASTS: NONE SEEN [LPF]
CRYSTALS: NONE SEEN [HPF]
Glucose, UA: NEGATIVE
HGB URINE DIPSTICK: NEGATIVE
KETONES UR: NEGATIVE
LEUKOCYTES UA: NEGATIVE
Nitrite: NEGATIVE
Protein, ur: NEGATIVE
RBC / HPF: NONE SEEN RBC/HPF (ref <=2)
SQUAMOUS EPITHELIAL / LPF: NONE SEEN [HPF] (ref <=5)
Specific Gravity, Urine: 1.003 (ref 1.001–1.035)
WBC, UA: NONE SEEN WBC/HPF (ref <=5)
YEAST: NONE SEEN [HPF]
pH: 6.5 (ref 5.0–8.0)

## 2015-09-11 NOTE — Telephone Encounter (Signed)
I called patient and per DPR access note I left message in voice mail that Nexplanon and insertion are covered by her UMR policy at 185%.  Codes [id number] and [id number]. I spoke with Otila Kluver. Call [phone].

## 2015-09-16 DIAGNOSIS — H6122 Impacted cerumen, left ear: Secondary | ICD-10-CM | POA: Diagnosis not present

## 2015-09-16 DIAGNOSIS — Z87891 Personal history of nicotine dependence: Secondary | ICD-10-CM | POA: Diagnosis not present

## 2015-09-16 DIAGNOSIS — H93293 Other abnormal auditory perceptions, bilateral: Secondary | ICD-10-CM | POA: Diagnosis not present

## 2015-09-30 ENCOUNTER — Encounter: Payer: Self-pay | Admitting: Women's Health

## 2015-10-07 ENCOUNTER — Encounter: Payer: Self-pay | Admitting: Family Medicine

## 2015-10-07 ENCOUNTER — Ambulatory Visit (INDEPENDENT_AMBULATORY_CARE_PROVIDER_SITE_OTHER): Payer: 59 | Admitting: Family Medicine

## 2015-10-07 VITALS — BP 120/80 | HR 88 | Temp 98.3°F | Ht 59.0 in | Wt 85.0 lb

## 2015-10-07 DIAGNOSIS — Z7689 Persons encountering health services in other specified circumstances: Secondary | ICD-10-CM

## 2015-10-07 DIAGNOSIS — F411 Generalized anxiety disorder: Secondary | ICD-10-CM | POA: Insufficient documentation

## 2015-10-07 DIAGNOSIS — E876 Hypokalemia: Secondary | ICD-10-CM

## 2015-10-07 DIAGNOSIS — F17203 Nicotine dependence unspecified, with withdrawal: Secondary | ICD-10-CM

## 2015-10-07 DIAGNOSIS — K900 Celiac disease: Secondary | ICD-10-CM | POA: Diagnosis not present

## 2015-10-07 DIAGNOSIS — Z7189 Other specified counseling: Secondary | ICD-10-CM | POA: Diagnosis not present

## 2015-10-07 DIAGNOSIS — Z8659 Personal history of other mental and behavioral disorders: Secondary | ICD-10-CM
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
## 2015-10-07 NOTE — Progress Notes (Signed)
Pre visit review using our clinic review tool, if applicable. No additional management support is needed unless otherwise documented below in the visit note. 

## 2015-10-07 NOTE — Patient Instructions (Signed)
BEFORE YOU LEAVE: -follow up:  1) labs when convenient for you 2) follow up with Dr. Maudie Mercury in about 1-2 months regarding weight  Consider counseling.  Consider getting personal trainer.  3 meals daily and snacks and try to up calories.

## 2015-10-15 DIAGNOSIS — F411 Generalized anxiety disorder: Secondary | ICD-10-CM | POA: Diagnosis not present

## 2015-10-21 DIAGNOSIS — F411 Generalized anxiety disorder: Secondary | ICD-10-CM | POA: Diagnosis not present

## 2015-10-29 DIAGNOSIS — F064 Anxiety disorder due to known physiological condition: Secondary | ICD-10-CM | POA: Diagnosis not present

## 2015-10-30 ENCOUNTER — Other Ambulatory Visit (INDEPENDENT_AMBULATORY_CARE_PROVIDER_SITE_OTHER): Payer: 59

## 2015-10-30 DIAGNOSIS — E876 Hypokalemia: Secondary | ICD-10-CM | POA: Diagnosis not present

## 2015-10-30 DIAGNOSIS — K900 Celiac disease: Secondary | ICD-10-CM

## 2015-10-30 LAB — BASIC METABOLIC PANEL
BUN: 15 mg/dL (ref 6–23)
CALCIUM: 9.7 mg/dL (ref 8.4–10.5)
CO2: 28 meq/L (ref 19–32)
Chloride: 104 mEq/L (ref 96–112)
Creatinine, Ser: 0.98 mg/dL (ref 0.40–1.20)
GFR: 74.6 mL/min (ref >60.00)
Glucose, Bld: 148 mg/dL — ABNORMAL HIGH (ref 70–99)
Potassium: 4.1 mEq/L (ref 3.5–5.1)
SODIUM: 138 meq/L (ref 135–145)

## 2015-10-30 LAB — CBC WITH DIFFERENTIAL/PLATELET
BASOS ABS: 0 10*3/uL (ref 0.0–0.1)
BASOS PCT: 0.3 % (ref 0.0–3.0)
EOS ABS: 0.1 10*3/uL (ref 0.0–0.7)
Eosinophils Relative: 2 % (ref 0.0–5.0)
HEMATOCRIT: 43.2 % (ref 36.0–46.0)
HEMOGLOBIN: 15 g/dL (ref 12.0–15.0)
LYMPHS PCT: 29.8 % (ref 12.0–46.0)
Lymphs Abs: 1.4 10*3/uL (ref 0.7–4.0)
MCHC: 34.7 g/dL (ref 30.0–36.0)
MCV: 89.3 fl (ref 78.0–100.0)
Monocytes Absolute: 0.3 10*3/uL (ref 0.1–1.0)
Monocytes Relative: 5.8 % (ref 3.0–12.0)
Neutro Abs: 2.8 10*3/uL (ref 1.4–7.7)
Neutrophils Relative %: 62.1 % (ref 43.0–77.0)
Platelets: 291 10*3/uL (ref 150.0–400.0)
RBC: 4.84 Mil/uL (ref 3.87–5.11)
RDW: 12.8 % (ref 11.5–15.5)
WBC: 4.6 10*3/uL (ref 4.0–10.5)

## 2015-10-30 LAB — VITAMIN D 25 HYDROXY (VIT D DEFICIENCY, FRACTURES): VITD: 35.56 ng/mL (ref 30.00–100.00)

## 2015-11-03 ENCOUNTER — Telehealth: Payer: Self-pay | Admitting: Family Medicine

## 2015-11-03 NOTE — Telephone Encounter (Signed)
Pt needs new rx epipen [facility]

## 2015-11-05 DIAGNOSIS — F064 Anxiety disorder due to known physiological condition: Secondary | ICD-10-CM | POA: Diagnosis not present

## 2015-11-05 DIAGNOSIS — F411 Generalized anxiety disorder: Secondary | ICD-10-CM | POA: Diagnosis not present

## 2015-11-05 NOTE — Telephone Encounter (Signed)
This is not on her medication list. Can you see what she has this for and whom rxd in the past? Then ok to rx. Thanks.

## 2015-11-06 ENCOUNTER — Encounter: Payer: Self-pay | Admitting: Gastroenterology

## 2015-11-06 ENCOUNTER — Encounter: Payer: Self-pay | Admitting: Family Medicine

## 2015-11-06 ENCOUNTER — Ambulatory Visit (INDEPENDENT_AMBULATORY_CARE_PROVIDER_SITE_OTHER): Payer: 59 | Admitting: Family Medicine

## 2015-11-06 VITALS — BP 102/72 | HR 88 | Temp 98.2°F | Ht 59.0 in | Wt 83.1 lb

## 2015-11-06 DIAGNOSIS — F418 Other specified anxiety disorders: Secondary | ICD-10-CM

## 2015-11-06 DIAGNOSIS — K900 Celiac disease: Secondary | ICD-10-CM

## 2015-11-06 DIAGNOSIS — Z8659 Personal history of other mental and behavioral disorders: Secondary | ICD-10-CM | POA: Diagnosis not present

## 2015-11-06 DIAGNOSIS — Z889 Allergy status to unspecified drugs, medicaments and biological substances status: Secondary | ICD-10-CM

## 2015-11-06 DIAGNOSIS — R634 Abnormal weight loss: Secondary | ICD-10-CM

## 2015-11-06 DIAGNOSIS — R636 Underweight: Secondary | ICD-10-CM

## 2015-11-06 LAB — HEPATIC FUNCTION PANEL
ALBUMIN: 4.7 g/dL (ref 3.5–5.2)
ALK PHOS: 56 U/L (ref 39–117)
ALT: 19 U/L (ref 0–35)
AST: 17 U/L (ref 0–37)
Bilirubin, Direct: 0.1 mg/dL (ref 0.0–0.3)
Total Bilirubin: 0.4 mg/dL (ref 0.2–1.2)
Total Protein: 7.1 g/dL (ref 6.0–8.3)

## 2015-11-06 LAB — TSH: TSH: 2.03 u[IU]/mL (ref 0.35–4.50)

## 2015-11-06 NOTE — Progress Notes (Signed)
Pre visit review using our clinic review tool, if applicable. No additional management support is needed unless otherwise documented below in the visit note. 
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
## 2015-11-06 NOTE — Telephone Encounter (Signed)
Patient was seen today and Dr Maudie Mercury stated she will send the Rx for the pt.

## 2015-11-06 NOTE — Patient Instructions (Addendum)
BEFORE YOU LEAVE: -labs -follow up: 2 months  We placed a referral for you as discussed to the dietitian, the allergist and the gastroenterologist. It usually takes about 1-2 weeks to process and schedule this referral. If you have not heard from [place] regarding this appointment in 2 weeks please contact our office.  Please continue to work on getting 3 good-sized meals per day and snacks. Please add a women's multivitamin once daily for now. Please also consider seeing a personal trainer to assist with starting exercise and ensuring adequate nutrition for exercise.  Continue to see the counselor, and let me know if prior to our next appointment you decide that you are ready to see a psychiatrist as well to consider medications.

## 2015-11-12 DIAGNOSIS — F064 Anxiety disorder due to known physiological condition: Secondary | ICD-10-CM | POA: Diagnosis not present

## 2015-11-13 ENCOUNTER — Ambulatory Visit (INDEPENDENT_AMBULATORY_CARE_PROVIDER_SITE_OTHER): Payer: 59 | Admitting: Gastroenterology

## 2015-11-13 ENCOUNTER — Other Ambulatory Visit (INDEPENDENT_AMBULATORY_CARE_PROVIDER_SITE_OTHER): Payer: 59

## 2015-11-13 ENCOUNTER — Encounter: Payer: Self-pay | Admitting: Gastroenterology

## 2015-11-13 VITALS — BP 100/60 | HR 70 | Ht 59.25 in | Wt 85.4 lb

## 2015-11-13 DIAGNOSIS — K900 Celiac disease: Secondary | ICD-10-CM

## 2015-11-13 DIAGNOSIS — R634 Abnormal weight loss: Secondary | ICD-10-CM | POA: Diagnosis not present

## 2015-11-13 LAB — IGA: IGA: 110 mg/dL (ref 68–378)

## 2015-11-13 NOTE — Patient Instructions (Signed)
Your physician has requested that you go to the basement for the following lab work before leaving today: TTG, IGA.  You have been scheduled for an abdominal ultrasound at [facility] (1st floor of hospital) on 11/20/15 at 8:30am. Please arrive 15 minutes prior to your appointment for registration. Make certain not to have anything to eat or drink 6 hours prior to your appointment. Should you need to reschedule your appointment, please contact radiology at [phone]. This test typically takes about 30 minutes to perform.  Thank you for choosing me and [facility].  Pricilla Riffle. Dagoberto Ligas., MD., Marval Regal

## 2015-11-13 NOTE — Progress Notes (Signed)
    History of Present Illness: This is a 25 year old female referred by Lucretia Kern, DO for the evaluation of weight loss. She has a history of celiac disease and an eating disorder. She reports that she is seeing a counselor for panic/anxiety disorder regarding foods and possible OCD. She has a prior history of anorexia and bulimia 5 years ago but she does not feel that this is an active problem at this time. She has anxiety before meals. She does not want to take medications for anxiety, or OCD. Claims compliance with gluten free diet since diagnosis at age 47. He states she had EGDs performed between 2000 and 2003 diagnosing celiac disease. CMP, TSH, CBC performed on October 19 were normal. She has had mild constipation recently which has happened in the past when she eats less high fiber foods. She states her weight has increased 2 pounds over the past week and she is eating more. She states her goal weight is around 93-95 pounds. She states her highest weight was 103. Denies abdominal pain, diarrhea, change in stool caliber, melena, hematochezia, nausea, vomiting, dysphagia, reflux symptoms, chest pain.  Review of Systems: Pertinent positive and negative review of systems were noted in the above HPI section. All other review of systems were otherwise negative.  Current Medications, Allergies, Past Medical History, Past Surgical History, Family History and Social History were reviewed in [employer] record.  Physical Exam: General: Well developed, undernourished, thin, no acute distress Head: Normocephalic and atraumatic Eyes:  sclerae anicteric, EOMI Ears: Normal auditory acuity Mouth: No deformity or lesions Neck: Supple, no masses or thyromegaly Lungs: Clear throughout to auscultation Heart: Regular rate and rhythm; no murmurs, rubs or bruits Abdomen: Soft, non tender and non distended. No masses, hepatosplenomegaly or hernias noted. Normal Bowel  sounds Musculoskeletal: Symmetrical with no gross deformities  Skin: No lesions on visible extremities Pulses:  Normal pulses noted Extremities: No clubbing, cyanosis, edema or deformities noted Neurological: Alert oriented x 4, grossly nonfocal Cervical Nodes:  No significant cervical adenopathy Inguinal Nodes: No significant inguinal adenopathy Psychological:  Alert and cooperative. Normal mood and affect  Assessment and Recommendations:  1.  Weight loss, however her oral intake and weight has increased 2 pounds recently. Weight problems and eating problems are likely related to anxiety, OCD or other psychiatric disorder. Rule out active celiac disease. Schedule abdominal ultrasound to evaluate for other causes of weight loss.  2.  Celiac disease. tTG and IgA today. If tTG is elevated will plan to proceed with EGD. Strongly consider EGD even if TTG is not elevated to assure celiac disease is adequately treated. She is somewhat reluctant to proceed with endoscopy at this time but may reconsider it at her follow-up visit in 2 months.  3. Mild constipation. Attempt to increase dietary fiber and water intake.   cc: Lucretia Kern, DO [address] [place], [place] [address]

## 2015-11-14 LAB — TISSUE TRANSGLUTAMINASE, IGA: Tissue Transglutaminase Ab, IgA: 2 U/mL (ref <4)

## 2015-11-19 ENCOUNTER — Encounter: Payer: Self-pay | Admitting: Family Medicine

## 2015-11-20 ENCOUNTER — Ambulatory Visit (HOSPITAL_COMMUNITY)
Admission: RE | Admit: 2015-11-20 | Discharge: 2015-11-20 | Disposition: A | Discharge status: Home / Self Care | Payer: 59 | Source: Ambulatory Visit | Attending: Gastroenterology | Admitting: Gastroenterology

## 2015-11-20 DIAGNOSIS — R634 Abnormal weight loss: Secondary | ICD-10-CM

## 2015-11-20 MED ORDER — EPINEPHRINE 0.3 MG/0.3ML IJ SOAJ: 0.3000 mg | Freq: Once | INTRAMUSCULAR | 3 refills | Status: AC

## 2015-11-25 DIAGNOSIS — F429 Obsessive-compulsive disorder, unspecified: Secondary | ICD-10-CM | POA: Diagnosis not present

## 2015-11-25 DIAGNOSIS — F064 Anxiety disorder due to known physiological condition: Secondary | ICD-10-CM | POA: Diagnosis not present

## 2015-12-03 DIAGNOSIS — F429 Obsessive-compulsive disorder, unspecified: Secondary | ICD-10-CM | POA: Diagnosis not present

## 2015-12-03 DIAGNOSIS — F064 Anxiety disorder due to known physiological condition: Secondary | ICD-10-CM | POA: Diagnosis not present

## 2015-12-08 DIAGNOSIS — F064 Anxiety disorder due to known physiological condition: Secondary | ICD-10-CM | POA: Diagnosis not present

## 2015-12-08 DIAGNOSIS — F429 Obsessive-compulsive disorder, unspecified: Secondary | ICD-10-CM | POA: Diagnosis not present

## 2015-12-16 ENCOUNTER — Encounter: Payer: Self-pay | Admitting: Family Medicine

## 2015-12-16 ENCOUNTER — Ambulatory Visit (INDEPENDENT_AMBULATORY_CARE_PROVIDER_SITE_OTHER): Payer: 59 | Admitting: Family Medicine

## 2015-12-16 DIAGNOSIS — F509 Eating disorder, unspecified: Secondary | ICD-10-CM

## 2015-12-16 NOTE — Patient Instructions (Addendum)
-   Congrat's on doing a great job with getting yourself to eat more food and expanding your choices.  - As you are able, get back to consistent exercise, especially strength training.    - Keep in mind that to gain muscle, you absolutely need not just protein, but adequate energy (calories).    - Review pdf file emailed to you today re. [phone] process of challenging derailing thoughts.    - Call if you feel you need some help or if you have Qs:  [phone] / [email or web address].

## 2015-12-16 NOTE — Progress Notes (Signed)
Medical Nutrition Therapy:  Appt start time: 1500 end time:  1600. PCP:   Colin Benton, DO Therapist:   Madalyn Rob, LPCA  Assessment:  Primary concerns today: evaluation for eating disorder.  Dave was referred by both her PCP Colin Benton, DO and therapist Madalyn Rob.  She has a h/o OCD, anxiety, celiac (since childhood), and anorexia nervosa w/ purging (4 yrs ago).  She has had no ED symptoms for at least 2 1/2 years.  Leonetta had a panic attack last Dec, and has since had a fear of anaphylactic reaction to eating.  She did experience throat swelling after eating salmon once in high school.    Chalonda's food anxiety has improved greatly in the past couple of months.  For example, she tried a new food today, but just sat with her nervousness, which eventually passed.  She has worked extensively with her therapist Madalyn Rob on this.  Progress is indicated also in weight gain; up to 87.8 from 85.4 lb a month ago.  She seemed genuinely pleased at the weight gain today.    I shared the process of challenging derailing thoughts about food/eating that was adapted from the process developed for OCD management, but adapted for disordered eating by [facility], named the [phone] process.  I emailed S[place]avia a pdf of the slides reviewed today, and provided the handout for it.  [place] seemed receptive.    I feel that Mallisa is on a good trajectory of recovery, and did not recommend she schedule a follow-up appt at this time.    Learning Readiness: Change in progress  Usual eating pattern includes 3 meals and 0-1 snack per day. Frequent foods and beverages include 1 c coffee w/ 2 pkts Splenda, cream/whole milk, 1-4 c 2%/whole milk, 6-12 oz whole milk yogurt (12-24 g protein), meat, chx, beef.  Avoided foods include salmon and trout, (anaphylactic-type allergies), all fish and shellfish (related to fears of allergy), eggplant (oral sensitivity - itchiness), bananas (disliked), seasonal air-borne  allergies.   Usual physical activity includes gym workouts (strength training) ~60 min less than once a week; working as a Quarry manager at a nursing home.  24-hr recall: (Up at 8 AM) B (8:10 AM)-   5 oz coffee, 6 tbsp cream, 2 Splenda, 6 oz whole milk vanilla yogurt, 2 slc bread, 2 tsp cream chs Snk ( AM)-   ---- L (1:30 PM)-  Amy's GF frzn meal, water Snk ( PM)-  ---- D (11 PM)-  2 1/2 pc GF pizza, water Snk ( PM)-  6 oz whole milk vanilla yogurt Typical day? No.  Doesn't eat pizza often, and meal was delayed b/c of not getting usual break at work.    Progress Towards Goal(s):  Some progress.   Nutritional Diagnosis:  NB-1.2 Harmful beliefs/attitudes about food or nutrition-related topics (use with caution) As related to previous bad experiences with certain foods.  As evidenced by extreme food anxiety.    Intervention:  Nutrition education.   Handouts given during visit include:  AVS  PDF file of slides on [employer] process  Demonstrated degree of understanding via:  Teach Back  Barriers to learning/adherence to lifestyle change: No significant barriers perceived.    Monitoring/Evaluation:  Dietary intake, exercise, and body weight prn.

## 2015-12-19 DIAGNOSIS — F064 Anxiety disorder due to known physiological condition: Secondary | ICD-10-CM | POA: Diagnosis not present

## 2015-12-19 DIAGNOSIS — F429 Obsessive-compulsive disorder, unspecified: Secondary | ICD-10-CM | POA: Diagnosis not present

## 2015-12-22 DIAGNOSIS — J301 Allergic rhinitis due to pollen: Secondary | ICD-10-CM | POA: Diagnosis not present

## 2015-12-22 DIAGNOSIS — J3089 Other allergic rhinitis: Secondary | ICD-10-CM | POA: Diagnosis not present

## 2015-12-22 DIAGNOSIS — J3081 Allergic rhinitis due to animal (cat) (dog) hair and dander: Secondary | ICD-10-CM | POA: Diagnosis not present

## 2015-12-22 DIAGNOSIS — Z91013 Allergy to seafood: Secondary | ICD-10-CM | POA: Diagnosis not present

## 2015-12-25 DIAGNOSIS — Z91013 Allergy to seafood: Secondary | ICD-10-CM | POA: Diagnosis not present

## 2015-12-25 DIAGNOSIS — F064 Anxiety disorder due to known physiological condition: Secondary | ICD-10-CM | POA: Diagnosis not present

## 2016-01-01 ENCOUNTER — Encounter: Payer: Self-pay | Admitting: Women's Health

## 2016-01-01 ENCOUNTER — Other Ambulatory Visit: Payer: Self-pay

## 2016-01-01 DIAGNOSIS — F064 Anxiety disorder due to known physiological condition: Secondary | ICD-10-CM | POA: Diagnosis not present

## 2016-01-06 ENCOUNTER — Encounter: Payer: Self-pay | Admitting: Family Medicine

## 2016-01-06 ENCOUNTER — Ambulatory Visit (INDEPENDENT_AMBULATORY_CARE_PROVIDER_SITE_OTHER): Payer: 59 | Admitting: Family Medicine

## 2016-01-06 VITALS — BP 92/68 | HR 83 | Temp 98.6°F | Ht 59.0 in | Wt 88.9 lb

## 2016-01-06 DIAGNOSIS — R634 Abnormal weight loss: Secondary | ICD-10-CM | POA: Diagnosis not present

## 2016-01-06 DIAGNOSIS — F411 Generalized anxiety disorder: Secondary | ICD-10-CM

## 2016-01-06 DIAGNOSIS — K900 Celiac disease: Secondary | ICD-10-CM

## 2016-01-06 DIAGNOSIS — Z8659 Personal history of other mental and behavioral disorders: Secondary | ICD-10-CM | POA: Diagnosis not present

## 2016-01-06 NOTE — Progress Notes (Signed)
Pre visit review using our clinic review tool, if applicable. No additional management support is needed unless otherwise documented below in the visit note. 

## 2016-01-06 NOTE — Patient Instructions (Signed)
BEFORE YOU LEAVE: -follow up: 4-6 months  Keep up the healthy, regular diet!  Gradually continue to increase exercise as manageable with your schedule!  Follow up with the gastroenterologist as planned and with your counselor and allergist.  I am so glad you are feeling better!  Please feel free to follow up sooner or contact us if any concerns in the interim.  Have a Merry Christmas!
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
## 2016-01-08 DIAGNOSIS — F064 Anxiety disorder due to known physiological condition: Secondary | ICD-10-CM | POA: Diagnosis not present

## 2016-01-08 DIAGNOSIS — Z91013 Allergy to seafood: Secondary | ICD-10-CM | POA: Diagnosis not present

## 2016-01-22 DIAGNOSIS — F064 Anxiety disorder due to known physiological condition: Secondary | ICD-10-CM | POA: Diagnosis not present

## 2016-01-29 DIAGNOSIS — F064 Anxiety disorder due to known physiological condition: Secondary | ICD-10-CM | POA: Diagnosis not present

## 2016-02-12 DIAGNOSIS — F064 Anxiety disorder due to known physiological condition: Secondary | ICD-10-CM | POA: Diagnosis not present

## 2016-02-26 DIAGNOSIS — F064 Anxiety disorder due to known physiological condition: Secondary | ICD-10-CM | POA: Diagnosis not present

## 2016-03-05 DIAGNOSIS — F064 Anxiety disorder due to known physiological condition: Secondary | ICD-10-CM | POA: Diagnosis not present

## 2016-03-11 DIAGNOSIS — F064 Anxiety disorder due to known physiological condition: Secondary | ICD-10-CM | POA: Diagnosis not present

## 2016-03-18 DIAGNOSIS — F064 Anxiety disorder due to known physiological condition: Secondary | ICD-10-CM | POA: Diagnosis not present

## 2016-03-25 DIAGNOSIS — F064 Anxiety disorder due to known physiological condition: Secondary | ICD-10-CM | POA: Diagnosis not present

## 2016-04-01 DIAGNOSIS — F064 Anxiety disorder due to known physiological condition: Secondary | ICD-10-CM | POA: Diagnosis not present

## 2016-04-08 DIAGNOSIS — F064 Anxiety disorder due to known physiological condition: Secondary | ICD-10-CM | POA: Diagnosis not present

## 2016-04-15 DIAGNOSIS — F064 Anxiety disorder due to known physiological condition: Secondary | ICD-10-CM | POA: Diagnosis not present

## 2016-04-22 DIAGNOSIS — F064 Anxiety disorder due to known physiological condition: Secondary | ICD-10-CM | POA: Diagnosis not present

## 2016-04-29 DIAGNOSIS — F064 Anxiety disorder due to known physiological condition: Secondary | ICD-10-CM | POA: Diagnosis not present

## 2016-05-01 DIAGNOSIS — F064 Anxiety disorder due to known physiological condition: Secondary | ICD-10-CM | POA: Diagnosis not present

## 2016-05-13 DIAGNOSIS — F064 Anxiety disorder due to known physiological condition: Secondary | ICD-10-CM | POA: Diagnosis not present

## 2016-05-20 DIAGNOSIS — F064 Anxiety disorder due to known physiological condition: Secondary | ICD-10-CM | POA: Diagnosis not present

## 2016-05-27 ENCOUNTER — Encounter (HOSPITAL_COMMUNITY): Payer: Self-pay | Admitting: Emergency Medicine

## 2016-05-27 ENCOUNTER — Ambulatory Visit (HOSPITAL_COMMUNITY)
Admission: EM | Admit: 2016-05-27 | Discharge: 2016-05-27 | Disposition: A | Discharge status: Home / Self Care | Payer: 59 | Attending: Internal Medicine | Admitting: Internal Medicine

## 2016-05-27 DIAGNOSIS — F064 Anxiety disorder due to known physiological condition: Secondary | ICD-10-CM | POA: Diagnosis not present

## 2016-05-27 DIAGNOSIS — R42 Dizziness and giddiness: Secondary | ICD-10-CM

## 2016-05-27 DIAGNOSIS — R0981 Nasal congestion: Secondary | ICD-10-CM

## 2016-05-27 MED ORDER — PREDNISONE 50 MG PO TABS: 50.0000 mg | ORAL_TABLET | Freq: Every day | ORAL | 0 refills | Status: DC

## 2016-05-27 MED ORDER — TRIAMCINOLONE ACETONIDE 55 MCG/ACT NA AERO: 2.0000 | INHALATION_SPRAY | Freq: Every day | NASAL | 0 refills | Status: DC

## 2016-05-27 NOTE — ED Provider Notes (Signed)
[place]    CSN: [id number] Arrival date & time: 05/27/16  1246     History   Chief Complaint Chief Complaint  Patient presents with  . Otalgia    left  . Dizziness    HPI Beth Hayes is a 26 y.o. female. She presents today with a couple weeks history of sinus congestion, ear congestion, little bit of headache. No fever, no malaise. The little dizzy in the last week or so, usually episodes lasting a couple of seconds, but yesterday had dizziness that lasted for a little longer, it actually felt better when she was moving around. She describes this as kind of swimmy headed, not a rotatory sensation, not made worse by head or eye movement. Yesterday had an earache on the left, today just feels like pressure.  HPI  Past Medical History:  Diagnosis Date  . Allergy   . Anorexia   . Bulimia   . Celiac disease   . Celiac sprue     Patient Active Problem List   Diagnosis Date Noted  . Anxiety state 10/07/2015  . Hx of eating disorder 10/07/2015  . Celiac sprue     Past Surgical History:  Procedure Laterality Date  . ADENOIDECTOMY  01/03/2012   Procedure: ADENOIDECTOMY;  Surgeon: Jodi Marble, MD;  Location: [place];  Service: ENT;  Laterality: N/A;  . BIOPSY BOWEL  age 32 mos. and age 49  . BIOPSY BOWEL     small intestine 2002  . INCISION AND DRAINAGE     [id number] by Dr Erik Obey  . INCISION AND DRAINAGE OF PERITONSILLAR ABCESS  11/26/2010   was not a tonsillectomy  . TONSILLECTOMY  11/26/2010   Procedure: TONSILLECTOMY;  Surgeon: Tyson Alias;  Location: [place];  Service: ENT;  Laterality: Right;  I & D right peritonsillar abscess  . TONSILLECTOMY  01/03/2012   Procedure: TONSILLECTOMY;  Surgeon: Jodi Marble, MD;  Location: [place];  Service: ENT;  Laterality: N/A;  . TONSILLECTOMY AND ADENOIDECTOMY  2013    OB History    Gravida Para Term Preterm AB Living   0 0 0 0 0 0   SAB  TAB Ectopic Multiple Live Births   0 0 0 0         Home Medications    Prior to Admission medications   Medication Sig Start Date End Date Taking? Authorizing Provider  predniSONE (DELTASONE) 50 MG tablet Take 1 tablet (50 mg total) by mouth daily. 05/27/16   Sherlene Shams, MD  triamcinolone (NASACORT) 55 MCG/ACT AERO nasal inhaler Place 2 sprays into the nose daily. 05/27/16   Sherlene Shams, MD    Family History Family History  Problem Relation Age of Onset  . Cancer Paternal Grandfather        pancreatic  . Alcohol abuse Paternal Grandfather   . Heart disease Paternal Grandmother   . Mental illness Paternal Aunt        schizophrenia  . Celiac disease Brother     Social History Social History  Substance Use Topics  . Smoking status: Former Smoker    Types: E-cigarettes    Quit date: 09/28/2015  . Smokeless tobacco: Never Used     Comment: Vape  . Alcohol use 0.0 oz/week     Comment: Rare     Allergies   Salmon [fish allergy]; Gluten meal; Amoxicillin; and Penicillins   Review of Systems Review of Systems  All other  systems reviewed and are negative.    Physical Exam Triage Vital Signs ED Triage Vitals  Enc Vitals Group     BP 05/27/16 1404 (!) 150/84     Pulse Rate 05/27/16 1404 70     Resp --      Temp 05/27/16 1404 98.3 F (36.8 C)     Temp Source 05/27/16 1404 Oral     SpO2 05/27/16 1404 100 %     Weight --      Height --      Pain Score 05/27/16 1405 1     Pain Loc --    Updated Vital Signs BP (!) 146/92 (BP Location: Left Arm)   Pulse 70   Temp 98.3 F (36.8 C) (Oral)   LMP 05/24/2016 (Approximate)   SpO2 100%   Physical Exam  Constitutional: She is oriented to person, place, and time. No distress.  HENT:  Head: Atraumatic.  Bilateral TMs are without erythema, left TM is fairly translucent, right TM is moderately dull. Marked nasal congestion, left greater than right Throat is slightly injected with clear postnasal drainage  Eyes:   Conjugate gaze observed, no eye redness/discharge  Neck: Neck supple.  Cardiovascular: Normal rate and regular rhythm.   Pulmonary/Chest: No respiratory distress. She has no wheezes. She has no rales.  Lungs clear, symmetric breath sounds  Abdominal: She exhibits no distension.  Musculoskeletal: Normal range of motion.  Neurological: She is alert and oriented to person, place, and time.  Skin: Skin is warm and dry.  Nursing note and vitals reviewed.    UC Treatments / Results   Procedures Procedures (including critical care time) None today  Final Clinical Impressions(s) / UC Diagnoses   Final diagnoses:  Sinus congestion  Dizziness   Congestion on exam, possible source for dizziness.  Prescription for prednisone and nasal steroid spray sent to the [facility] on [address].  Recheck or followup with primary care provider if increasing dizziness, falls, palpitations, or if not starting to improve in a few days.  New Prescriptions New Prescriptions   PREDNISONE (DELTASONE) 50 MG TABLET    Take 1 tablet (50 mg total) by mouth daily.   TRIAMCINOLONE (NASACORT) 55 MCG/ACT AERO NASAL INHALER    Place 2 sprays into the nose daily.     Sherlene Shams, MD 05/31/16 [id number]

## 2016-05-27 NOTE — Discharge Instructions (Addendum)
Congestion on exam, possible source for dizziness.  Prescription for prednisone and nasal steroid spray sent to the [facility] on [address].  Recheck or followup with primary care provider if increasing dizziness, falls, palpitations, or if not starting to improve in a few days.

## 2016-06-02 ENCOUNTER — Encounter: Payer: Self-pay | Admitting: Gynecology

## 2016-06-03 DIAGNOSIS — F064 Anxiety disorder due to known physiological condition: Secondary | ICD-10-CM | POA: Diagnosis not present

## 2016-06-07 ENCOUNTER — Ambulatory Visit (INDEPENDENT_AMBULATORY_CARE_PROVIDER_SITE_OTHER): Payer: 59 | Admitting: Family Medicine

## 2016-06-07 ENCOUNTER — Encounter: Payer: Self-pay | Admitting: Family Medicine

## 2016-06-07 VITALS — BP 102/78 | HR 90 | Temp 98.1°F | Ht 59.0 in | Wt 90.0 lb

## 2016-06-07 DIAGNOSIS — H6981 Other specified disorders of Eustachian tube, right ear: Secondary | ICD-10-CM

## 2016-06-07 DIAGNOSIS — J329 Chronic sinusitis, unspecified: Secondary | ICD-10-CM

## 2016-06-07 DIAGNOSIS — M791 Myalgia, unspecified site: Secondary | ICD-10-CM
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
## 2016-06-07 NOTE — Patient Instructions (Addendum)
Keep follow up as scheduled.  Continue Nasacort for at least 1 month.  Add Allegra or Claritin once daily  Afrin nasal spray for 4 days. DO NOT use longer then 4 days.  Tylenol, aleve or topical menthol (tiger balm) and heat for the muscle or head pain.  I hope you are feeling better soon! Good luck on the test! Seek care immediately if worsening, new concerns or you are not improving with treatment.

## 2016-06-11 DIAGNOSIS — F064 Anxiety disorder due to known physiological condition: Secondary | ICD-10-CM | POA: Diagnosis not present

## 2016-06-11 DIAGNOSIS — Z113 Encounter for screening for infections with a predominantly sexual mode of transmission: Secondary | ICD-10-CM | POA: Diagnosis not present

## 2016-06-12 DIAGNOSIS — Z113 Encounter for screening for infections with a predominantly sexual mode of transmission: Secondary | ICD-10-CM | POA: Diagnosis not present

## 2016-06-24 DIAGNOSIS — F064 Anxiety disorder due to known physiological condition: Secondary | ICD-10-CM | POA: Diagnosis not present

## 2016-06-27 ENCOUNTER — Encounter: Payer: Self-pay | Admitting: Women's Health

## 2016-06-28 ENCOUNTER — Other Ambulatory Visit: Payer: Self-pay | Admitting: Women's Health

## 2016-06-28 MED ORDER — NORETHIN ACE-ETH ESTRAD-FE 1.5-30 MG-MCG PO TABS: 1.0000 | ORAL_TABLET | Freq: Every day | ORAL | 0 refills | Status: DC

## 2016-06-30 DIAGNOSIS — F064 Anxiety disorder due to known physiological condition: Secondary | ICD-10-CM | POA: Diagnosis not present
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
## 2016-07-06 ENCOUNTER — Ambulatory Visit (INDEPENDENT_AMBULATORY_CARE_PROVIDER_SITE_OTHER): Payer: 59 | Admitting: Family Medicine

## 2016-07-06 VITALS — BP 118/76 | HR 88 | Temp 98.2°F | Wt 91.8 lb

## 2016-07-06 DIAGNOSIS — Z720 Tobacco use: Secondary | ICD-10-CM

## 2016-07-06 DIAGNOSIS — Z8659 Personal history of other mental and behavioral disorders: Secondary | ICD-10-CM | POA: Diagnosis not present

## 2016-07-06 DIAGNOSIS — F411 Generalized anxiety disorder: Secondary | ICD-10-CM

## 2016-07-06 DIAGNOSIS — R253 Fasciculation: Secondary | ICD-10-CM

## 2016-07-06 DIAGNOSIS — K900 Celiac disease: Secondary | ICD-10-CM | POA: Diagnosis not present

## 2016-07-06 LAB — BASIC METABOLIC PANEL
BUN: 16 mg/dL (ref 6–23)
CHLORIDE: 103 meq/L (ref 96–112)
CO2: 26 meq/L (ref 19–32)
Calcium: 9.7 mg/dL (ref 8.4–10.5)
Creatinine, Ser: 0.92 mg/dL (ref 0.40–1.20)
GFR: 79.77 mL/min (ref >60.00)
Glucose, Bld: 95 mg/dL (ref 70–99)
POTASSIUM: 4 meq/L (ref 3.5–5.1)
SODIUM: 139 meq/L (ref 135–145)

## 2016-07-06 LAB — MAGNESIUM: Magnesium: 2 mg/dL (ref 1.5–2.5)

## 2016-07-06 LAB — CBC
HEMATOCRIT: 43 % (ref 36.0–46.0)
Hemoglobin: 14.8 g/dL (ref 12.0–15.0)
MCHC: 34.3 g/dL (ref 30.0–36.0)
MCV: 91.7 fl (ref 78.0–100.0)
PLATELETS: 307 10*3/uL (ref 150.0–400.0)
RBC: 4.69 Mil/uL (ref 3.87–5.11)
RDW: 12.5 % (ref 11.5–15.5)
WBC: 4.7 10*3/uL (ref 4.0–10.5)

## 2016-07-06 LAB — VITAMIN B12: Vitamin B-12: 440 pg/mL (ref 211–911)

## 2016-07-06 NOTE — Patient Instructions (Signed)
BEFORE YOU LEAVE: -follow up: 3-6 months -labs  Please quit smoking!  Continue a healthy gluten free well balanced diet.  We have ordered labs or studies at this visit. It can take up to 1-2 weeks for results and processing. IF results require follow up or explanation, we will call you with instructions. Clinically stable results will be released to your [facility]. If you have not heard from [place] or cannot find your results in [facility] in 2 weeks please contact our office at [phone].  If you are not yet signed up for [facility], please consider signing up.  Consider stopping supplements and trying each for 2 weeks to see which is helping.  Have a fun trip!

## 2016-07-08 DIAGNOSIS — F064 Anxiety disorder due to known physiological condition: Secondary | ICD-10-CM | POA: Diagnosis not present

## 2016-07-15 DIAGNOSIS — F064 Anxiety disorder due to known physiological condition: Secondary | ICD-10-CM | POA: Diagnosis not present

## 2016-08-05 DIAGNOSIS — F064 Anxiety disorder due to known physiological condition: Secondary | ICD-10-CM | POA: Diagnosis not present

## 2016-08-07 ENCOUNTER — Ambulatory Visit (INDEPENDENT_AMBULATORY_CARE_PROVIDER_SITE_OTHER): Payer: 59 | Admitting: Family Medicine

## 2016-08-07 ENCOUNTER — Encounter: Payer: Self-pay | Admitting: Family Medicine

## 2016-08-07 VITALS — BP 98/64 | HR 73 | Temp 98.9°F | Ht 59.0 in | Wt 93.0 lb

## 2016-08-07 DIAGNOSIS — N3000 Acute cystitis without hematuria: Secondary | ICD-10-CM

## 2016-08-07 DIAGNOSIS — R300 Dysuria: Secondary | ICD-10-CM | POA: Diagnosis not present

## 2016-08-07 LAB — POCT URINALYSIS DIPSTICK
BILIRUBIN UA: NEGATIVE
Glucose, UA: NEGATIVE
Ketones, UA: NEGATIVE
LEUKOCYTES UA: NEGATIVE
NITRITE UA: NEGATIVE
PH UA: 6 (ref 5.0–8.0)
PROTEIN UA: NEGATIVE
RBC UA: NEGATIVE
Spec Grav, UA: 1.01 (ref 1.010–1.025)
UROBILINOGEN UA: 0.2 U/dL

## 2016-08-07 MED ORDER — SULFAMETHOXAZOLE-TRIMETHOPRIM 800-160 MG PO TABS: 1.0000 | ORAL_TABLET | Freq: Two times a day (BID) | ORAL | 0 refills | Status: DC

## 2016-08-07 NOTE — Progress Notes (Signed)
Subjective:    Patient ID: Beth Hayes, female    DOB: 04/06/1990, 26 y.o.   MRN: [id number]  HPI Here with urinary symptoms   [place]  Did not urinate after intercourse   Urinating frequently  Burns to urinate  Urgency  No blood in urine  No flank pain  No low back pain   No fever  Some nausea   On OC  No chance she is pregnant- finishing menses now   Last uti was 3 y ago   Drinking lots of fluids  Sitting on toilet all am    Results for orders placed or performed in visit on 08/07/16  POCT urinalysis dipstick  Result Value Ref Range   Color, UA yellow    Clarity, UA clear    Glucose, UA neg    Bilirubin, UA neg    Ketones, UA neg    Spec Grav, UA 1.010 1.010 - 1.025   Blood, UA neg    pH, UA 6.0 5.0 - 8.0   Protein, UA neg    Urobilinogen, UA 0.2 0.2 or 1.0 E.U./dL   Nitrite, UA neg    Leukocytes, UA Negative Negative     Pt drank a lot of water this am   Patient Active Problem List   Diagnosis Date Noted  . UTI (urinary tract infection) 08/07/2016  . Tobacco use 07/06/2016  . Muscle twitch 07/06/2016  . Anxiety state 10/07/2015  . Hx of eating disorder 10/07/2015  . Celiac sprue    Past Medical History:  Diagnosis Date  . Allergy   . Anorexia   . Bulimia   . Celiac disease   . Celiac sprue    Past Surgical History:  Procedure Laterality Date  . ADENOIDECTOMY  01/03/2012   Procedure: ADENOIDECTOMY;  Surgeon: Jodi Marble, MD;  Location: [place];  Service: ENT;  Laterality: N/A;  . BIOPSY BOWEL  age 58 mos. and age 31  . BIOPSY BOWEL     small intestine 2002  . INCISION AND DRAINAGE     [id number] by Dr Erik Obey  . INCISION AND DRAINAGE OF PERITONSILLAR ABCESS  11/26/2010   was not a tonsillectomy  . TONSILLECTOMY  11/26/2010   Procedure: TONSILLECTOMY;  Surgeon: Tyson Alias;  Location: [place];  Service: ENT;  Laterality: Right;  I & D right peritonsillar abscess  .  TONSILLECTOMY  01/03/2012   Procedure: TONSILLECTOMY;  Surgeon: Jodi Marble, MD;  Location: [place];  Service: ENT;  Laterality: N/A;  . TONSILLECTOMY AND ADENOIDECTOMY  2013   Social History  Substance Use Topics  . Smoking status: Former Smoker    Types: E-cigarettes    Quit date: 09/28/2015  . Smokeless tobacco: Never Used     Comment: Vape  . Alcohol use 0.0 oz/week     Comment: Rare   Family History  Problem Relation Age of Onset  . Cancer Paternal Grandfather        pancreatic  . Alcohol abuse Paternal Grandfather   . Heart disease Paternal Grandmother   . Mental illness Paternal Aunt        schizophrenia  . Celiac disease Brother    Allergies  Allergen Reactions  . Salmon [Fish Allergy] Anaphylaxis    Also trout  . Gluten Meal Other (See Comments)    DUE TO CELIAC DISEASE  . Amoxicillin Rash  . Penicillins Rash   Current Outpatient Prescriptions on File Prior  to Visit  Medication Sig Dispense Refill  . norethindrone-ethinyl estradiol-iron (MICROGESTIN FE,GILDESS FE,LOESTRIN FE) 1.5-30 MG-MCG tablet Take 1 tablet by mouth daily. 3 Package 0  . triamcinolone (NASACORT) 55 MCG/ACT AERO nasal inhaler Place 2 sprays into the nose daily. 1 Inhaler 0   No current facility-administered medications on file prior to visit.     Review of Systems Review of Systems  Constitutional: Negative for fever, appetite change, fatigue and unexpected weight change.  Eyes: Negative for pain and visual disturbance.  Respiratory: Negative for cough and shortness of breath.   Cardiovascular: Negative for cp or palpitations    Gastrointestinal: Negative for nausea, diarrhea and constipation.  Genitourinary: pos  for urgency and frequency. pos for dysuria and neg for hematuria  Skin: Negative for pallor or rash   Neurological: Negative for weakness, light-headedness, numbness and headaches.  Hematological: Negative for adenopathy. Does not bruise/bleed easily.    Psychiatric/Behavioral: Negative for dysphoric mood. The patient is not nervous/anxious.         Objective:   Physical Exam  Constitutional: She appears well-developed and well-nourished. No distress.  Well appearing   HENT:  Head: Normocephalic and atraumatic.  Eyes: Pupils are equal, round, and reactive to light. Conjunctivae and EOM are normal.  Neck: Normal range of motion. Neck supple.  Cardiovascular: Normal rate, regular rhythm and normal heart sounds.   Pulmonary/Chest: Effort normal and breath sounds normal.  Abdominal: Soft. Bowel sounds are normal. She exhibits no distension. There is tenderness. There is no rebound.  No cva tenderness  Mild suprapubic tenderness  Musculoskeletal: She exhibits no edema.  Lymphadenopathy:    She has no cervical adenopathy.  Neurological: She is alert.  Skin: No rash noted.  Psychiatric: She has a normal mood and affect.          Assessment & Plan:   Problem List Items Addressed This Visit      Genitourinary   UTI (urinary tract infection)    Neg ua but classic symptoms and pt had large intake of water this am  tx with 5 d of bactrim DS Update if not starting to improve in a week or if worsening          Relevant Medications   sulfamethoxazole-trimethoprim (BACTRIM DS,SEPTRA DS) 800-160 MG tablet    Other Visit Diagnoses    Dysuria    -  Primary   Relevant Orders   POCT urinalysis dipstick (Completed)

## 2016-08-07 NOTE — Patient Instructions (Addendum)
Drink lots of water / avoid excessive soda/tea/coffee Take the bactrim ds as directed Wear your sunscreen   Update if not starting to improve in a week or if worsening     Urinary Tract Infection, Adult A urinary tract infection (UTI) is an infection of any part of the urinary tract, which includes the kidneys, ureters, bladder, and urethra. These organs make, store, and get rid of urine in the body. UTI can be a bladder infection (cystitis) or kidney infection (pyelonephritis). What are the causes? This infection may be caused by fungi, viruses, or bacteria. Bacteria are the most common cause of UTIs. This condition can also be caused by repeated incomplete emptying of the bladder during urination. What increases the risk? This condition is more likely to develop if:  You ignore your need to urinate or hold urine for long periods of time.  You do not empty your bladder completely during urination.  You wipe back to front after urinating or having a bowel movement, if you are female.  You are uncircumcised, if you are female.  You are constipated.  You have a urinary catheter that stays in place (indwelling).  You have a weak defense (immune) system.  You have a medical condition that affects your bowels, kidneys, or bladder.  You have diabetes.  You take antibiotic medicines frequently or for long periods of time, and the antibiotics no longer work well against certain types of infections (antibiotic resistance).  You take medicines that irritate your urinary tract.  You are exposed to chemicals that irritate your urinary tract.  You are female.  What are the signs or symptoms? Symptoms of this condition include:  Fever.  Frequent urination or passing small amounts of urine frequently.  Needing to urinate urgently.  Pain or burning with urination.  Urine that smells bad or unusual.  Cloudy urine.  Pain in the lower abdomen or back.  Trouble urinating.  Blood  in the urine.  Vomiting or being less hungry than normal.  Diarrhea or abdominal pain.  Vaginal discharge, if you are female.  How is this diagnosed? This condition is diagnosed with a medical history and physical exam. You will also need to provide a urine sample to test your urine. Other tests may be done, including:  Blood tests.  Sexually transmitted disease (STD) testing.  If you have had more than one UTI, a cystoscopy or imaging studies may be done to determine the cause of the infections. How is this treated? Treatment for this condition often includes a combination of two or more of the following:  Antibiotic medicine.  Other medicines to treat less common causes of UTI.  Over-the-counter medicines to treat pain.  Drinking enough water to stay hydrated.  Follow these instructions at home:  Take over-the-counter and prescription medicines only as told by your health care provider.  If you were prescribed an antibiotic, take it as told by your health care provider. Do not stop taking the antibiotic even if you start to feel better.  Avoid alcohol, caffeine, tea, and carbonated beverages. They can irritate your bladder.  Drink enough fluid to keep your urine clear or pale yellow.  Keep all follow-up visits as told by your health care provider. This is important.  Make sure to: ? Empty your bladder often and completely. Do not hold urine for long periods of time. ? Empty your bladder before and after sex. ? Wipe from front to back after a bowel movement if you are female.  Use each tissue one time when you wipe. Contact a health care provider if:  You have back pain.  You have a fever.  You feel nauseous or vomit.  Your symptoms do not get better after 3 days.  Your symptoms go away and then return. Get help right away if:  You have severe back pain or lower abdominal pain.  You are vomiting and cannot keep down any medicines or water. This information  is not intended to replace advice given to you by your health care provider. Make sure you discuss any questions you have with your health care provider. Document Released: 10/14/2004 Document Revised: 06/18/2015 Document Reviewed: 11/25/2014 Elsevier Interactive Patient Education  2017 [employer].

## 2016-08-08 NOTE — Assessment & Plan Note (Signed)
Neg ua but classic symptoms and pt had large intake of water this am  tx with 5 d of bactrim DS Update if not starting to improve in a week or if worsening

## 2016-08-12 DIAGNOSIS — F064 Anxiety disorder due to known physiological condition: Secondary | ICD-10-CM | POA: Diagnosis not present

## 2016-08-19 DIAGNOSIS — F064 Anxiety disorder due to known physiological condition: Secondary | ICD-10-CM | POA: Diagnosis not present

## 2016-08-23 ENCOUNTER — Telehealth: Payer: Self-pay | Admitting: Family Medicine

## 2016-08-23 NOTE — Telephone Encounter (Signed)
° ° °  Pt went to the [facility] a couple of weeks ago and they gave her the below med. Pt said she don't think it cleared up and is asking for a refill on the below med   sulfamethoxazole-trimethoprim (BACTRIM DS,SEPTRA DS) 800-160 MG tablet   [facility]

## 2016-08-25 NOTE — Telephone Encounter (Signed)
Sorry she is not feeling well - Advise appt so we can check urine/culture/etc so we can make sure we find out what we are treating - can't see where culture was done. Thanks!

## 2016-08-26 NOTE — Telephone Encounter (Signed)
I called the pt and informed her of the message below and offered an appt for today. Patient stated she is feeling a lot better and "flushed out this by drinking a lot of water" and will call back for an appt if needed.

## 2016-09-01 DIAGNOSIS — F064 Anxiety disorder due to known physiological condition: Secondary | ICD-10-CM | POA: Diagnosis not present

## 2016-09-02 ENCOUNTER — Encounter: Payer: Self-pay | Admitting: Women's Health

## 2016-09-10 ENCOUNTER — Encounter: Payer: 59 | Admitting: Women's Health

## 2016-09-13 ENCOUNTER — Ambulatory Visit (INDEPENDENT_AMBULATORY_CARE_PROVIDER_SITE_OTHER): Payer: 59 | Admitting: Family Medicine

## 2016-09-13 ENCOUNTER — Encounter: Payer: Self-pay | Admitting: Family Medicine

## 2016-09-13 VITALS — BP 128/78 | HR 99 | Temp 98.6°F | Ht 59.0 in | Wt 94.0 lb

## 2016-09-13 DIAGNOSIS — Z8659 Personal history of other mental and behavioral disorders: Secondary | ICD-10-CM | POA: Diagnosis not present

## 2016-09-13 DIAGNOSIS — M25473 Effusion, unspecified ankle: Secondary | ICD-10-CM

## 2016-09-13 DIAGNOSIS — Z720 Tobacco use: Secondary | ICD-10-CM

## 2016-09-13 DIAGNOSIS — K900 Celiac disease: Secondary | ICD-10-CM | POA: Diagnosis not present
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
## 2016-09-16 DIAGNOSIS — F064 Anxiety disorder due to known physiological condition: Secondary | ICD-10-CM | POA: Diagnosis not present

## 2016-09-19 ENCOUNTER — Other Ambulatory Visit: Payer: Self-pay | Admitting: Women's Health

## 2016-09-23 DIAGNOSIS — F064 Anxiety disorder due to known physiological condition: Secondary | ICD-10-CM | POA: Diagnosis not present

## 2016-10-07 ENCOUNTER — Encounter: Payer: Self-pay | Admitting: Family Medicine

## 2016-10-07 DIAGNOSIS — F064 Anxiety disorder due to known physiological condition: Secondary | ICD-10-CM | POA: Diagnosis not present

## 2016-10-12 DIAGNOSIS — F064 Anxiety disorder due to known physiological condition: Secondary | ICD-10-CM | POA: Diagnosis not present

## 2016-10-13 ENCOUNTER — Encounter: Payer: Self-pay | Admitting: Women's Health

## 2016-10-13 ENCOUNTER — Ambulatory Visit (INDEPENDENT_AMBULATORY_CARE_PROVIDER_SITE_OTHER): Payer: 59 | Admitting: Women's Health

## 2016-10-13 VITALS — BP 128/80 | Ht 59.0 in | Wt 95.0 lb

## 2016-10-13 DIAGNOSIS — Z113 Encounter for screening for infections with a predominantly sexual mode of transmission: Secondary | ICD-10-CM

## 2016-10-13 DIAGNOSIS — Z3041 Encounter for surveillance of contraceptive pills: Secondary | ICD-10-CM | POA: Diagnosis not present

## 2016-10-13 DIAGNOSIS — Z01419 Encounter for gynecological examination (general) (routine) without abnormal findings: Secondary | ICD-10-CM

## 2016-10-13 MED ORDER — NORETHIN ACE-ETH ESTRAD-FE 1.5-30 MG-MCG PO TABS: 1.0000 | ORAL_TABLET | Freq: Every day | ORAL | 4 refills | Status: DC

## 2016-10-13 NOTE — Progress Notes (Signed)
Beth Hayes 1991/01/13 [id number]    History:    Presents for annual exam.  Monthly cycle on Loestrin without complaint. Gardasil series completed. Normal Pap history. New partner. History of eating disorder, weight stable, is in counseling.  Past medical history, past surgical history, family history and social history were all reviewed and documented in the EPIC chart. 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 Recently applied to numerous PA programs. Both parents are physicians.  ROS:  A ROS was performed and pertinent positives and negatives are included.  Exam:  Vitals:   10/13/16 1025  BP: 128/80  Weight: 95 lb (43.1 kg)  Height: 4' 11"  (1.499 m)   Body mass index is 19.19 kg/m.   General appearance:  Normal Thyroid:  Symmetrical, normal in size, without palpable masses or nodularity. Respiratory  Auscultation:  Clear without wheezing or rhonchi Cardiovascular  Auscultation:  Regular rate, without rubs, murmurs or gallops  Edema/varicosities:  Not grossly evident Abdominal  Soft,nontender, without masses, guarding or rebound.  Liver/spleen:  No organomegaly noted  Hernia:  None appreciated  Skin  Inspection:  Grossly normal   Breasts: Examined lying and sitting.     Right: Without masses, retractions, discharge or axillary adenopathy.     Left: Without masses, retractions, discharge or axillary adenopathy. Gentitourinary   Inguinal/mons:  Normal without inguinal adenopathy  External genitalia:  Normal  BUS/Urethra/Skene's glands:  Normal  Vagina:  Normal  Cervix:  Normal  Uterus:  normal in size, shape and contour.  Midline and mobile  Adnexa/parametria:     Rt: Without masses or tenderness.   Lt: Without masses or tenderness.  Anus and perineum: Normal    Assessment/Plan:  26 y.o. S WF G0 for annual exam with no complaints.  Contraception management STD screen  Plan: Contraception options reviewed, contemplating IUD, reviewed slight risk of  infection, perforation or hemorrhage, if decides wants to proceed with Mirena or ParaGard, instructed to call office to check benefits and have Dr. Phineas Real place with cycle. Will continue on Loestrin 1.5/30 prescription, proper use given and reviewed slight risk for blood clots and strokes. Condoms encouraged until permanent partner. SBE's, exercise, calcium rich diet, MVI daily encouraged. CBC, HIV, hep B, C, RPR. GC/Chlamydia. Pap normal 2016, new screening guidelines reviewed.  [name] [facility], 11:06 AM 10/13/2016

## 2016-10-13 NOTE — Patient Instructions (Signed)
Health Maintenance, Female Adopting a healthy lifestyle and getting preventive care can go a long way to promote health and wellness. Talk with your health care provider about what schedule of regular examinations is right for you. This is a good chance for you to check in with your provider about disease prevention and staying healthy. In between checkups, there are plenty of things you can do on your own. Experts have done a lot of research about which lifestyle changes and preventive measures are most likely to keep you healthy. Ask your health care provider for more information. Weight and diet Eat a healthy diet  Be sure to include plenty of vegetables, fruits, low-fat dairy products, and lean protein.  Do not eat a lot of foods high in solid fats, added sugars, or salt.  Get regular exercise. This is one of the most important things you can do for your health. ? Most adults should exercise for at least 150 minutes each week. The exercise should increase your heart rate and make you sweat (moderate-intensity exercise). ? Most adults should also do strengthening exercises at least twice a week. This is in addition to the moderate-intensity exercise.  Maintain a healthy weight  Body mass index (BMI) is a measurement that can be used to identify possible weight problems. It estimates body fat based on height and weight. Your health care provider can help determine your BMI and help you achieve or maintain a healthy weight.  For females 20 years of age and older: ? A BMI below 18.5 is considered underweight. ? A BMI of 18.5 to 24.9 is normal. ? A BMI of 25 to 29.9 is considered overweight. ? A BMI of 30 and above is considered obese.  Watch levels of cholesterol and blood lipids  You should start having your blood tested for lipids and cholesterol at 24 years of age, then have this test every 5 years.  You may need to have your cholesterol levels checked more often if: ? Your lipid or  cholesterol levels are high. ? You are older than 24 years of age. ? You are at high risk for heart disease.  Cancer screening Lung Cancer  Lung cancer screening is recommended for adults 55-80 years old who are at high risk for lung cancer because of a history of smoking.  A yearly low-dose CT scan of the lungs is recommended for people who: ? Currently smoke. ? Have quit within the past 15 years. ? Have at least a 30-pack-year history of smoking. A pack year is smoking an average of one pack of cigarettes a day for 1 year.  Yearly screening should continue until it has been 15 years since you quit.  Yearly screening should stop if you develop a health problem that would prevent you from having lung cancer treatment.  Breast Cancer  Practice breast self-awareness. This means understanding how your breasts normally appear and feel.  It also means doing regular breast self-exams. Let your health care provider know about any changes, no matter how small.  If you are in your 20s or 30s, you should have a clinical breast exam (CBE) by a health care provider every 1-3 years as part of a regular health exam.  If you are 40 or older, have a CBE every year. Also consider having a breast X-ray (mammogram) every year.  If you have a family history of breast cancer, talk to your health care provider about genetic screening.  If you are at high risk   for breast cancer, talk to your health care provider about having an MRI and a mammogram every year.  Breast cancer gene (BRCA) assessment is recommended for women who have family members with BRCA-related cancers. BRCA-related cancers include: ? Breast. ? Ovarian. ? Tubal. ? Peritoneal cancers.  Results of the assessment will determine the need for genetic counseling and BRCA1 and BRCA2 testing.  Cervical Cancer Your health care provider may recommend that you be screened regularly for cancer of the pelvic organs (ovaries, uterus, and  vagina). This screening involves a pelvic examination, including checking for microscopic changes to the surface of your cervix (Pap test). You may be encouraged to have this screening done every 3 years, beginning at age 22.  For women ages 56-65, health care providers may recommend pelvic exams and Pap testing every 3 years, or they may recommend the Pap and pelvic exam, combined with testing for human papilloma virus (HPV), every 5 years. Some types of HPV increase your risk of cervical cancer. Testing for HPV may also be done on women of any age with unclear Pap test results.  Other health care providers may not recommend any screening for nonpregnant women who are considered low risk for pelvic cancer and who do not have symptoms. Ask your health care provider if a screening pelvic exam is right for you.  If you have had past treatment for cervical cancer or a condition that could lead to cancer, you need Pap tests and screening for cancer for at least 20 years after your treatment. If Pap tests have been discontinued, your risk factors (such as having a new sexual partner) need to be reassessed to determine if screening should resume. Some women have medical problems that increase the chance of getting cervical cancer. In these cases, your health care provider may recommend more frequent screening and Pap tests.  Colorectal Cancer  This type of cancer can be detected and often prevented.  Routine colorectal cancer screening usually begins at 24 years of age and continues through 24 years of age.  Your health care provider may recommend screening at an earlier age if you have risk factors for colon cancer.  Your health care provider may also recommend using home test kits to check for hidden blood in the stool.  A small camera at the end of a tube can be used to examine your colon directly (sigmoidoscopy or colonoscopy). This is done to check for the earliest forms of colorectal  cancer.  Routine screening usually begins at age 33.  Direct examination of the colon should be repeated every 5-10 years through 24 years of age. However, you may need to be screened more often if early forms of precancerous polyps or small growths are found.  Skin Cancer  Check your skin from head to toe regularly.  Tell your health care provider about any new moles or changes in moles, especially if there is a change in a mole's shape or color.  Also tell your health care provider if you have a mole that is larger than the size of a pencil eraser.  Always use sunscreen. Apply sunscreen liberally and repeatedly throughout the day.  Protect yourself by wearing long sleeves, pants, a wide-brimmed hat, and sunglasses whenever you are outside.  Heart disease, diabetes, and high blood pressure  High blood pressure causes heart disease and increases the risk of stroke. High blood pressure is more likely to develop in: ? People who have blood pressure in the high end of  the normal range (130-139/85-89 mm Hg). ? People who are overweight or obese. ? People who are African American.  If you are 21-29 years of age, have your blood pressure checked every 3-5 years. If you are 3 years of age or older, have your blood pressure checked every year. You should have your blood pressure measured twice-once when you are at a hospital or clinic, and once when you are not at a hospital or clinic. Record the average of the two measurements. To check your blood pressure when you are not at a hospital or clinic, you can use: ? An automated blood pressure machine at a pharmacy. ? A home blood pressure monitor.  If you are between 17 years and 37 years old, ask your health care provider if you should take aspirin to prevent strokes.  Have regular diabetes screenings. This involves taking a blood sample to check your fasting blood sugar level. ? If you are at a normal weight and have a low risk for diabetes,  have this test once every three years after 24 years of age. ? If you are overweight and have a high risk for diabetes, consider being tested at a younger age or more often. Preventing infection Hepatitis B  If you have a higher risk for hepatitis B, you should be screened for this virus. You are considered at high risk for hepatitis B if: ? You were born in a country where hepatitis B is common. Ask your health care provider which countries are considered high risk. ? Your parents were born in a high-risk country, and you have not been immunized against hepatitis B (hepatitis B vaccine). ? You have HIV or AIDS. ? You use needles to inject street drugs. ? You live with someone who has hepatitis B. ? You have had sex with someone who has hepatitis B. ? You get hemodialysis treatment. ? You take certain medicines for conditions, including cancer, organ transplantation, and autoimmune conditions.  Hepatitis C  Blood testing is recommended for: ? Everyone born from [birth year] through 1963. ? Anyone with known risk factors for hepatitis C.  Sexually transmitted infections (STIs)  You should be screened for sexually transmitted infections (STIs) including gonorrhea and chlamydia if: ? You are sexually active and are younger than 24 years of age. ? You are older than 24 years of age and your health care provider tells you that you are at risk for this type of infection. ? Your sexual activity has changed since you were last screened and you are at an increased risk for chlamydia or gonorrhea. Ask your health care provider if you are at risk.  If you do not have HIV, but are at risk, it may be recommended that you take a prescription medicine daily to prevent HIV infection. This is called pre-exposure prophylaxis (PrEP). You are considered at risk if: ? You are sexually active and do not regularly use condoms or know the HIV status of your partner(s). ? You take drugs by injection. ? You are  sexually active with a partner who has HIV.  Talk with your health care provider about whether you are at high risk of being infected with HIV. If you choose to begin PrEP, you should first be tested for HIV. You should then be tested every 3 months for as long as you are taking PrEP. Pregnancy  If you are premenopausal and you may become pregnant, ask your health care provider about preconception counseling.  If you may become  pregnant, take 400 to 800 micrograms (mcg) of folic acid every day.  If you want to prevent pregnancy, talk to your health care provider about birth control (contraception). Osteoporosis and menopause  Osteoporosis is a disease in which the bones lose minerals and strength with aging. This can result in serious bone fractures. Your risk for osteoporosis can be identified using a bone density scan.  If you are 3 years of age or older, or if you are at risk for osteoporosis and fractures, ask your health care provider if you should be screened.  Ask your health care provider whether you should take a calcium or vitamin D supplement to lower your risk for osteoporosis.  Menopause may have certain physical symptoms and risks.  Hormone replacement therapy may reduce some of these symptoms and risks. Talk to your health care provider about whether hormone replacement therapy is right for you. Follow these instructions at home:  Schedule regular health, dental, and eye exams.  Stay current with your immunizations.  Do not use any tobacco products including cigarettes, chewing tobacco, or electronic cigarettes.  If you are pregnant, do not drink alcohol.  If you are breastfeeding, limit how much and how often you drink alcohol.  Limit alcohol intake to no more than 1 drink per day for nonpregnant women. One drink equals 12 ounces of beer, 5 ounces of wine, or 1 ounces of hard liquor.  Do not use street drugs.  Do not share needles.  Ask your health care  provider for help if you need support or information about quitting drugs.  Tell your health care provider if you often feel depressed.  Tell your health care provider if you have ever been abused or do not feel safe at home. This information is not intended to replace advice given to you by your health care provider. Make sure you discuss any questions you have with your health care provider. Document Released: 07/20/2010 Document Revised: 06/12/2015 Document Reviewed: 10/08/2014 Elsevier Interactive Patient Education  2018 [employer]. Intrauterine Device Information An intrauterine device (IUD) is inserted into your uterus to prevent pregnancy. There are two types of IUDs available:  Copper IUD-This type of IUD is wrapped in copper wire and is placed inside the uterus. Copper makes the uterus and fallopian tubes produce a fluid that kills sperm. The copper IUD can stay in place for 10 years.  Hormone IUD-This type of IUD contains the hormone progestin (synthetic progesterone). The hormone thickens the cervical mucus and prevents sperm from entering the uterus. It also thins the uterine lining to prevent implantation of a fertilized egg. The hormone can weaken or kill the sperm that get into the uterus. One type of hormone IUD can stay in place for 5 years, and another type can stay in place for 3 years.  Your health care provider will make sure you are a good candidate for a contraceptive IUD. Discuss with your health care provider the possible side effects. Advantages of an intrauterine device  IUDs are highly effective, reversible, long acting, and low maintenance.  There are no estrogen-related side effects.  An IUD can be used when breastfeeding.  IUDs are not associated with weight gain.  The copper IUD works immediately after insertion.  The hormone IUD works right away if inserted within 7 days of your period starting. You will need to use a backup method of birth control for  7 days if the hormone IUD is inserted at any other time in your cycle.  The copper IUD does not interfere with your female hormones.  The hormone IUD can make heavy menstrual periods lighter and decrease cramping.  The hormone IUD can be used for 3 or 5 years.  The copper IUD can be used for 10 years. Disadvantages of an intrauterine device  The hormone IUD can be associated with irregular bleeding patterns.  The copper IUD can make your menstrual flow heavier and more painful.  You may experience cramping and vaginal bleeding after insertion. This information is not intended to replace advice given to you by your health care provider. Make sure you discuss any questions you have with your health care provider. Document Released: 12/09/2003 Document Revised: 06/12/2015 Document Reviewed: 06/25/2012 Elsevier Interactive Patient Education  2017 [employer].

## 2016-10-14 LAB — URINALYSIS W MICROSCOPIC + REFLEX CULTURE
BACTERIA UA: NONE SEEN /HPF
Bilirubin Urine: NEGATIVE
Glucose, UA: NEGATIVE
HGB URINE DIPSTICK: NEGATIVE
Hyaline Cast: NONE SEEN /LPF
KETONES UR: NEGATIVE
LEUKOCYTE ESTERASE: NEGATIVE
Nitrites, Initial: NEGATIVE
Protein, ur: NEGATIVE
RBC / HPF: NONE SEEN /HPF (ref 0–2)
SPECIFIC GRAVITY, URINE: 1.007 (ref 1.001–1.03)
SQUAMOUS EPITHELIAL / LPF: NONE SEEN /HPF (ref <=5)
WBC, UA: NONE SEEN /HPF (ref 0–5)
pH: 6 (ref 5.0–8.0)

## 2016-10-14 LAB — CBC WITH DIFFERENTIAL/PLATELET
BASOS ABS: 32 {cells}/uL (ref 0–200)
Basophils Relative: 0.8 %
EOS ABS: 272 {cells}/uL (ref 15–500)
Eosinophils Relative: 6.8 %
HEMATOCRIT: 40.9 % (ref 35.0–45.0)
HEMOGLOBIN: 13.9 g/dL (ref 11.7–15.5)
LYMPHS ABS: 1592 {cells}/uL (ref 850–3900)
MCH: 30.8 pg (ref 27.0–33.0)
MCHC: 34 g/dL (ref 32.0–36.0)
MCV: 90.7 fL (ref 80.0–100.0)
MPV: 9.1 fL (ref 7.5–12.5)
Monocytes Relative: 6.5 %
NEUTROS PCT: 46.1 %
Neutro Abs: 1844 cells/uL (ref 1500–7800)
Platelets: 272 10*3/uL (ref 140–400)
RBC: 4.51 10*6/uL (ref 3.80–5.10)
RDW: 11.7 % (ref 11.0–15.0)
Total Lymphocyte: 39.8 %
WBC mixed population: 260 cells/uL (ref 200–950)
WBC: 4 10*3/uL (ref 3.8–10.8)

## 2016-10-14 LAB — HEPATITIS C ANTIBODY
HEP C AB: NONREACTIVE
SIGNAL TO CUT-OFF: 0.04 (ref <1.00)

## 2016-10-14 LAB — NO CULTURE INDICATED

## 2016-10-14 LAB — C. TRACHOMATIS/N. GONORRHOEAE RNA
C. trachomatis RNA, TMA: NOT DETECTED
N. gonorrhoeae RNA, TMA: NOT DETECTED

## 2016-10-14 LAB — RPR: RPR: NONREACTIVE

## 2016-10-14 LAB — HEPATITIS B SURFACE ANTIGEN: HEP B S AG: NONREACTIVE

## 2016-10-14 LAB — HIV ANTIBODY (ROUTINE TESTING W REFLEX): HIV 1&2 Ab, 4th Generation: NONREACTIVE

## 2016-10-19 ENCOUNTER — Encounter: Payer: Self-pay | Admitting: Women's Health

## 2016-10-22 DIAGNOSIS — F064 Anxiety disorder due to known physiological condition: Secondary | ICD-10-CM | POA: Diagnosis not present

## 2016-10-28 ENCOUNTER — Encounter: Payer: Self-pay | Admitting: Gynecology

## 2016-10-28 ENCOUNTER — Ambulatory Visit (INDEPENDENT_AMBULATORY_CARE_PROVIDER_SITE_OTHER): Payer: 59 | Admitting: Gynecology

## 2016-10-28 VITALS — BP 114/70

## 2016-10-28 DIAGNOSIS — Z30430 Encounter for insertion of intrauterine contraceptive device: Secondary | ICD-10-CM

## 2016-10-28 DIAGNOSIS — F064 Anxiety disorder due to known physiological condition: Secondary | ICD-10-CM | POA: Diagnosis not present

## 2016-10-28 HISTORY — PX: INTRAUTERINE DEVICE INSERTION: SHX323

## 2016-10-28 NOTE — Progress Notes (Signed)
    Beth Hayes 08-21-1990 [id number]        26 y.o.  G0P0000  presents for ParaGard IUD placement. This talk to [place] about this. Previously using oral contraceptives but would prefer something nonhormonal.. She currently is at the end of a normal menses. I discussed with her the difference between the different IUDs and that with the ParaGard she may have heavier menses and more cramping and she is accepting of this.  I reviewed the insertional process with her as well as the risks to include infection, either immediate or long-term, uterine perforation or migration requiring surgery to remove, other complications such as pain, infertility and possibility of failure with subsequent pregnancy.   Exam with Caryn Bee assistant Vitals:   10/28/16 1524  BP: 114/70    Pelvic: External BUS vagina normal. Cervix normal with scant bleeding. Uterus anteverted normal size shape contour midline mobile nontender. Adnexa without masses or tenderness.  Procedure: The cervix was cleansed with Betadine, anterior lip grasped with a single-tooth tenaculum, the uterus was sounded and a ParaGard IUD was placed according to manufacturer's recommendations without difficulty. The strings were trimmed. The patient tolerated well and will follow up in one month for a postinsertional check.  P/N # [id number] ECR # [id number]    Anastasio Auerbach MD, 4:17 PM 10/28/2016

## 2016-10-28 NOTE — Patient Instructions (Signed)

## 2016-11-05 DIAGNOSIS — F064 Anxiety disorder due to known physiological condition: Secondary | ICD-10-CM | POA: Diagnosis not present

## 2016-11-09 DIAGNOSIS — F064 Anxiety disorder due to known physiological condition: Secondary | ICD-10-CM | POA: Diagnosis not present

## 2016-11-18 ENCOUNTER — Encounter: Payer: Self-pay | Admitting: Women's Health

## 2016-11-18 DIAGNOSIS — F064 Anxiety disorder due to known physiological condition: Secondary | ICD-10-CM | POA: Diagnosis not present

## 2016-11-19 ENCOUNTER — Encounter: Payer: Self-pay | Admitting: Gynecology

## 2016-11-19 ENCOUNTER — Ambulatory Visit (INDEPENDENT_AMBULATORY_CARE_PROVIDER_SITE_OTHER): Payer: 59 | Admitting: Gynecology

## 2016-11-19 VITALS — BP 126/78 | Temp 100.7°F

## 2016-11-19 DIAGNOSIS — R102 Pelvic and perineal pain: Secondary | ICD-10-CM | POA: Diagnosis not present

## 2016-11-19 DIAGNOSIS — Z113 Encounter for screening for infections with a predominantly sexual mode of transmission: Secondary | ICD-10-CM | POA: Diagnosis not present

## 2016-11-19 LAB — CBC WITH DIFFERENTIAL/PLATELET
BASOS ABS: 22 {cells}/uL (ref 0–200)
Basophils Relative: 0.2 %
EOS ABS: 233 {cells}/uL (ref 15–500)
Eosinophils Relative: 2.1 %
HEMATOCRIT: 40.9 % (ref 35.0–45.0)
Hemoglobin: 14.4 g/dL (ref 11.7–15.5)
LYMPHS ABS: 1232 {cells}/uL (ref 850–3900)
MCH: 31.2 pg (ref 27.0–33.0)
MCHC: 35.2 g/dL (ref 32.0–36.0)
MCV: 88.7 fL (ref 80.0–100.0)
MPV: 8.8 fL (ref 7.5–12.5)
Monocytes Relative: 6.2 %
NEUTROS PCT: 80.4 %
Neutro Abs: 8924 cells/uL — ABNORMAL HIGH (ref 1500–7800)
Platelets: 260 10*3/uL (ref 140–400)
RBC: 4.61 10*6/uL (ref 3.80–5.10)
RDW: 11.7 % (ref 11.0–15.0)
Total Lymphocyte: 11.1 %
WBC: 11.1 10*3/uL — ABNORMAL HIGH (ref 3.8–10.8)
WBCMIX: 688 {cells}/uL (ref 200–950)

## 2016-11-19 MED ORDER — DOXYCYCLINE HYCLATE 100 MG PO CAPS: 100.0000 mg | ORAL_CAPSULE | Freq: Two times a day (BID) | ORAL | 0 refills | Status: DC

## 2016-11-19 NOTE — Progress Notes (Signed)
    Beth Hayes 1990/04/15 [id number]        26 y.o.  G0P0000 presents with one-week history of lower abdominal cramping primarily on the left.  Had ParaGard IUD placed 10/28/2016.  Did well up until this past week.  No bleeding.  Does feel low-grade feverish but also notes she feels like she is coming down with a cold with some head congestion and having friends with URIs.  Some nausea over the last day or so.  No vomiting.  No diarrhea constipation.  Drinking and eating without difficulty.  Past medical history,surgical history, problem list, medications, allergies, family history and social history were all reviewed and documented in the EPIC chart.  Directed ROS with pertinent positives and negatives documented in the history of present illness/assessment and plan.  Exam: Wandra Scot assistant Vitals:   11/19/16 1406  BP: 126/78  Temp: (!) 100.7 F (38.2 C)  TempSrc: Oral   General appearance:  Normal HEENT normal without evidence of pharyngitis or cervical lymphadenopathy Lungs clear bilaterally Cardiac regular rate no rubs murmurs or gallops Abdomen soft nontender without masses guarding rebound active bowel sounds throughout Pelvic external BUS vagina normal.  Cervix normal.  With IUD string visualized.  GC/Chlamydia screen done.  No cervical motion tenderness with vigorous movement.  Uterus normal size midline mobile nontender with vigorous palpation.  Adnexa without masses or tenderness  Assessment/Plan:  26 y.o. G0P0000 lower abdominal cramping, recent IUD placement 3 weeks ago.  Low-grade fever 100.7 with some low-grade nausea.  Exam is unremarkable to include a vigorous pelvic exam showing no pelvic tenderness.  Situation complicated by patient feeling that she is coming down with a URI which may account for the low-grade fever and nausea.  I reviewed situation with the patient and the issues to include if pelvic infection in young woman with fertility desires would  this have adverse effect and whether we should pull her IUD now and start her on antibiotics for a presumptive low-grade endometritis.  Alternative would be leave the IUD in place and start her on antibiotics recognizing her symptoms may be unrelated to the IUD.  Also reviewed could be GI etiology such as a gastritis up to and including appendicitis although atypical.  I did a GC/chlamydia screen of the cervix today recognizing she had a recent screen that was negative the end of September.  We will also check a baseline CBC for white count.  She is thin and easy to examine that I do not think an ultrasound would add much to the management plan at this point.  After lengthy discussion again emphasizing the issue of fertility the patient prefers to keep her IUD.  We will start her on doxycycline 100 mg twice daily times 7 days.  ASAP call precautions reviewed.  She is going to call me regardless on Monday to let me know how she is doing and then we will go from there.    Anastasio Auerbach MD, 3:29 PM 11/19/2016

## 2016-11-19 NOTE — Patient Instructions (Signed)
Start on the antibiotics twice daily.  Call if you have any increasing pain or other symptoms develop to include persistent fever.

## 2016-11-20 LAB — URINALYSIS W MICROSCOPIC + REFLEX CULTURE
BILIRUBIN URINE: NEGATIVE
Bacteria, UA: NONE SEEN /HPF
GLUCOSE, UA: NEGATIVE
Hyaline Cast: NONE SEEN /LPF
KETONES UR: NEGATIVE
LEUKOCYTE ESTERASE: NEGATIVE
NITRITES URINE, INITIAL: NEGATIVE
Protein, ur: NEGATIVE
RBC / HPF: NONE SEEN /HPF (ref 0–2)
SPECIFIC GRAVITY, URINE: 1.01 (ref 1.001–1.03)
WBC UA: NONE SEEN /HPF (ref 0–5)
pH: 8 (ref 5.0–8.0)

## 2016-11-20 LAB — C. TRACHOMATIS/N. GONORRHOEAE RNA
C. TRACHOMATIS RNA, TMA: NOT DETECTED
N. gonorrhoeae RNA, TMA: NOT DETECTED

## 2016-11-20 LAB — NO CULTURE INDICATED

## 2016-11-22 ENCOUNTER — Encounter: Payer: Self-pay | Admitting: Gynecology

## 2016-11-22 ENCOUNTER — Telehealth: Payer: Self-pay | Admitting: *Deleted

## 2016-11-22 NOTE — Telephone Encounter (Signed)
Left message for pt to call.

## 2016-11-22 NOTE — Telephone Encounter (Signed)
-----   Message from Anastasio Auerbach, MD sent at 11/22/2016  2:40 PM EST ----- Check in follow-up to see how the patient is feeling.  Let me know if there are any issues or concern

## 2016-11-23 DIAGNOSIS — F064 Anxiety disorder due to known physiological condition: Secondary | ICD-10-CM | POA: Diagnosis not present

## 2016-11-23 NOTE — Telephone Encounter (Signed)
Blood work returned okay and her cervical cultures were negative.  She had a minimal elevation in her white count which may be the cold but not of any major significance.  As long as she is feeling better than will follow.  If she has any issues then she should call me.

## 2016-11-23 NOTE — Telephone Encounter (Signed)
Pt sent my chart message, I routed this message to Dr.Fontaine.

## 2016-11-27 ENCOUNTER — Encounter: Payer: Self-pay | Admitting: Women's Health

## 2016-11-30 DIAGNOSIS — F064 Anxiety disorder due to known physiological condition: Secondary | ICD-10-CM | POA: Diagnosis not present

## 2016-12-01 ENCOUNTER — Ambulatory Visit (INDEPENDENT_AMBULATORY_CARE_PROVIDER_SITE_OTHER): Payer: 59 | Admitting: Gynecology

## 2016-12-01 ENCOUNTER — Encounter: Payer: Self-pay | Admitting: Gynecology

## 2016-12-01 VITALS — BP 118/74

## 2016-12-01 DIAGNOSIS — Z30431 Encounter for routine checking of intrauterine contraceptive device: Secondary | ICD-10-CM

## 2016-12-01 NOTE — Progress Notes (Signed)
    Beth Hayes 1990/11/17 [id number]        26 y.o.  G0P0000 presents for IUD follow-up.  Had ParaGard placed 10/28/2016.  She was seen again with a low-grade fever and lower cramping.  These symptoms ultimately resolved and she feels that she was having a head cold which accounted for the low-grade fever.  Her exam was benign at the time with a negative GC/Chlamydia screen.  This is all documented in her 11/19/2016 office note.  She now is doing well now without issues.  Past medical history,surgical history, problem list, medications, allergies, family history and social history were all reviewed and documented in the EPIC chart.  Directed ROS with pertinent positives and negatives documented in the history of present illness/assessment and plan.  Exam: Caryn Bee assistant Vitals:   12/01/16 1625  BP: 118/74   General appearance:  Normal Abdomen soft nontender without masses guarding rebound Pelvic external BUS vagina normal.  Cervix normal.  IUD strings visualized.  Strings were trimmed.  Uterus normal size midline mobile nontender.  Adnexa without mass  Assessment/Plan:  26 y.o. G0P0000 with normal IUD follow-up exam.  Patient will keep menstrual calendar as long as acceptable will follow-up next fall when due for annual exam.  She will follow-up sooner if any issues at all.    Anastasio Auerbach MD, 4:35 PM 12/01/2016

## 2016-12-01 NOTE — Patient Instructions (Signed)
Follow-up next fall for annual exam, sooner if any issues with the IUD or other problems.

## 2016-12-08 DIAGNOSIS — F064 Anxiety disorder due to known physiological condition: Secondary | ICD-10-CM | POA: Diagnosis not present

## 2016-12-14 DIAGNOSIS — F064 Anxiety disorder due to known physiological condition: Secondary | ICD-10-CM | POA: Diagnosis not present

## 2016-12-22 DIAGNOSIS — F064 Anxiety disorder due to known physiological condition: Secondary | ICD-10-CM | POA: Diagnosis not present

## 2016-12-31 DIAGNOSIS — F064 Anxiety disorder due to known physiological condition: Secondary | ICD-10-CM | POA: Diagnosis not present

## 2017-01-04 DIAGNOSIS — F064 Anxiety disorder due to known physiological condition: Secondary | ICD-10-CM | POA: Diagnosis not present

## 2017-01-04 NOTE — Progress Notes (Signed)
HPI:  Beth Hayes is a pleasant 26 year old with a past medical history of celiac sprue, anxiety, eating disorder and smoking.  She has been seeing behavioral health for the eating disorder which was linked to a fear of eating certain foods after having an allergic reaction to fish.  Reports doing well for the most part.  She is applying to [employer] school and is frustrated that she got lined by several schools.  She is considering medical school and has been offered interviews to several.  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  Tries to eat during the shift, but that is a little tough.  Reports her weight has been doing good.  She is followed by the gastroenterologist for celiac sprue and reports she is adhering to a gluten-free diet.  Mood is good.  New symptom of postnasal drip and cough.  Started after a mild cold a few weeks ago.  Doing well otherwise. No SOB, wheezing, malaise, fever.  No longer smoking or using any vaping.  ROS: See pertinent positives and negatives per HPI.  Past Medical History:  Diagnosis Date  . Allergy   . Anorexia   . Bulimia   . Celiac disease   . Celiac sprue     Past Surgical History:  Procedure Laterality Date  . ADENOIDECTOMY  01/03/2012   Procedure: ADENOIDECTOMY;  Surgeon: Jodi Marble, MD;  Location: [facility];  Service: ENT;  Laterality: N/A;  . BIOPSY BOWEL  age 38 mos. and age 28  . BIOPSY BOWEL     small intestine 2002  . INCISION AND DRAINAGE     [id number] by Dr Erik Obey  . INCISION AND DRAINAGE OF PERITONSILLAR ABCESS  11/26/2010   was not a tonsillectomy  . INTRAUTERINE DEVICE INSERTION  10/28/2016   Paraguard  . TONSILLECTOMY  11/26/2010   Procedure: TONSILLECTOMY;  Surgeon: Tyson Alias;  Location: [facility];  Service: ENT;  Laterality: Right;  I & D right peritonsillar abscess  . TONSILLECTOMY  01/03/2012   Procedure: TONSILLECTOMY;  Surgeon: Jodi Marble, MD;   Location: [facility];  Service: ENT;  Laterality: N/A;  . TONSILLECTOMY AND ADENOIDECTOMY  2013    Family History  Problem Relation Age of Onset  . Cancer Paternal Grandfather        pancreatic  . Alcohol abuse Paternal Grandfather   . Heart disease Paternal Grandmother        a. fib  . Mental illness Paternal Aunt        schizophrenia  . Celiac disease Brother   . Other Other        fibromuscular dysplasia renal artery    Social History   Socioeconomic History  . Marital status: Single    Spouse name: None  . Number of children: None  . Years of education: None  . Highest education level: None  Social Needs  . Financial resource strain: None  . Food insecurity - worry: None  . Food insecurity - inability: None  . Transportation needs - medical: None  . Transportation needs - non-medical: None  Occupational History  . None  Tobacco Use  . Smoking status: Former Smoker    Types: E-cigarettes    Last attempt to quit: 09/28/2015    Years since quitting: 1.2  . Smokeless tobacco: Never Used  . Tobacco comment: Vape  Substance and Sexual Activity  . Alcohol use: Yes    Alcohol/week: 0.0  oz    Comment: Rare  . Drug use: No  . Sexual activity: Not Currently    Partners: Male    Birth control/protection: None, IUD    Comment: 1st intercourse 26 yo-More than 5 partners ParaGard IUD 10/28/2016  Other Topics Concern  . None  Social History Narrative   Work or Merchandiser, retail - interested in becoming provider      Home Situation: lives with alone and with parents      Spiritual Beliefs: none      Lifestyle: no regular exercise; diet is healthy     Current Outpatient Medications:  Marland Kitchen  Multiple Vitamin (MULTIVITAMIN) capsule, Take 1 capsule by mouth daily., Disp: , Rfl:  .  PARAGARD INTRAUTERINE COPPER IU, by Intrauterine route., Disp: , Rfl:  .  triamcinolone (NASACORT) 55 MCG/ACT AERO nasal inhaler, Place 2 sprays into the nose daily.,  Disp: 1 Inhaler, Rfl: 0  EXAM:  Vitals:   01/06/17 1044  BP: 118/62  Pulse: 91  Temp: (!) 97.3 F (36.3 C)    Body mass index is 18.68 kg/m.  GENERAL: vitals reviewed and listed above, alert, oriented, appears well hydrated and in no acute distress  HEENT: atraumatic, conjunttiva clear, no obvious abnormalities on inspection of external nose and ears.normal appearance of ear canals and TMs, clear nasal congestion, mild post oropharyngeal erythema with PND, no tonsillar edema or exudate, no sinus TTP  NECK: no obvious masses on inspection  LUNGS: clear to auscultation bilaterally, no wheezes, rales or rhonchi, good air movement  CV: HRRR, no peripheral edema  MS: moves all extremities without noticeable abnormality  PSYCH: pleasant and cooperative, no obvious depression or anxiety  ASSESSMENT AND PLAN:  Discussed the following assessment and plan:  Post-viral cough syndrome  Hx of eating disorder  Celiac sprue  -Findings on exam suggest the cough from postnasal drip, likely postviral, try an i intranasal steroid contact us for chest x-ray if not improving -Nutrition and healthy eating to help her weight at length -Advised GI follow-up at least yearly regarding her celiac sprue and strict adherence to gluten-free diet -Patient advised to return or notify a doctor immediately if symptoms worsen or persist or new concerns arise.  Patient Instructions  Please try adding Flonase or Nasonex for a few weeks to see if this helps with the cough.  Call us to let us know if this is not improving so we can check a chest x-ray.  Please follow-up with your gastroenterologist once yearly about your celiac disease and eat a strict gluten-free diet.  Try adding a clear boost or Ensure when you are at work and on long shifts, along with eating healthy snacks on a regular basis and at least 3 regular healthy meals daily.  Follow-up 4-6 months.   Colin Benton R., DO

## 2017-01-06 ENCOUNTER — Encounter: Payer: Self-pay | Admitting: Family Medicine

## 2017-01-06 ENCOUNTER — Ambulatory Visit (INDEPENDENT_AMBULATORY_CARE_PROVIDER_SITE_OTHER): Payer: 59 | Admitting: Family Medicine

## 2017-01-06 VITALS — BP 118/62 | HR 91 | Temp 97.3°F | Ht 59.0 in | Wt 92.5 lb

## 2017-01-06 DIAGNOSIS — K900 Celiac disease: Secondary | ICD-10-CM

## 2017-01-06 DIAGNOSIS — R05 Cough: Secondary | ICD-10-CM | POA: Diagnosis not present

## 2017-01-06 DIAGNOSIS — Z8659 Personal history of other mental and behavioral disorders: Secondary | ICD-10-CM | POA: Diagnosis not present

## 2017-01-06 DIAGNOSIS — R058 Other specified cough: Secondary | ICD-10-CM

## 2017-01-06 NOTE — Patient Instructions (Signed)
Please try adding Flonase or Nasonex for a few weeks to see if this helps with the cough.  Call us to let us know if this is not improving so we can check a chest x-ray.  Please follow-up with your gastroenterologist once yearly about your celiac disease and eat a strict gluten-free diet.  Try adding a clear boost or Ensure when you are at work and on long shifts, along with eating healthy snacks on a regular basis and at least 3 regular healthy meals daily.  Follow-up 4-6 months.

## 2017-01-19 DIAGNOSIS — F064 Anxiety disorder due to known physiological condition: Secondary | ICD-10-CM | POA: Diagnosis not present

## 2017-01-27 ENCOUNTER — Encounter: Payer: Self-pay | Admitting: Family Medicine

## 2017-01-28 ENCOUNTER — Other Ambulatory Visit: Payer: 59

## 2017-01-28 ENCOUNTER — Other Ambulatory Visit: Payer: Self-pay | Admitting: *Deleted

## 2017-01-28 DIAGNOSIS — Z113 Encounter for screening for infections with a predominantly sexual mode of transmission: Secondary | ICD-10-CM

## 2017-01-28 DIAGNOSIS — F064 Anxiety disorder due to known physiological condition: Secondary | ICD-10-CM | POA: Diagnosis not present

## 2017-01-29 LAB — HIV ANTIBODY (ROUTINE TESTING W REFLEX): HIV: NONREACTIVE

## 2017-02-04 DIAGNOSIS — F064 Anxiety disorder due to known physiological condition: Secondary | ICD-10-CM | POA: Diagnosis not present

## 2017-02-10 DIAGNOSIS — F064 Anxiety disorder due to known physiological condition: Secondary | ICD-10-CM | POA: Diagnosis not present

## 2017-02-20 ENCOUNTER — Encounter: Payer: Self-pay | Admitting: Women's Health

## 2017-02-22 ENCOUNTER — Encounter: Payer: Self-pay | Admitting: Women's Health

## 2017-02-22 ENCOUNTER — Ambulatory Visit (INDEPENDENT_AMBULATORY_CARE_PROVIDER_SITE_OTHER): Payer: 59 | Admitting: Women's Health

## 2017-02-22 VITALS — BP 118/74

## 2017-02-22 DIAGNOSIS — N898 Other specified noninflammatory disorders of vagina: Secondary | ICD-10-CM | POA: Diagnosis not present

## 2017-02-22 LAB — WET PREP FOR TRICH, YEAST, CLUE

## 2017-02-22 NOTE — Progress Notes (Signed)
27 yo G0 SWF presents with complaint of white discharge. Discharge and itching started 6 days ago and resolved 2 days ago. Denies abdominal pain, urinary symptoms, odor. 10/28/2016 - ParaGard IUD placed, monthly heavy periods with cramping, but not interfering with daily activities. Gardisil series complete. Same partner, denies need for STD screen. History of anxiety/eating disorder no current problems.  Exam: Appears well. External genitalia appears normal. Speculum exam, vagina appeared normal, scant white discharge, no odor or erythema noted. Wet prep, negative.   Vaginal Discharge  Plan: Reviewed normality of exam. Option reviewed, does not feel that she needs medication at this time. Counseled on genital hygiene to avoid yeast vaginitis. Will call if symptoms  become problematic.

## 2017-03-11 DIAGNOSIS — F064 Anxiety disorder due to known physiological condition: Secondary | ICD-10-CM | POA: Diagnosis not present

## 2017-03-17 DIAGNOSIS — F064 Anxiety disorder due to known physiological condition: Secondary | ICD-10-CM | POA: Diagnosis not present

## 2017-03-18 IMAGING — US US ABDOMEN COMPLETE
1 series · 14 of 25 positions shown · non-contrast
Comparison: No recent prior.

CLINICAL DATA: Weight loss.

EXAM:
ABDOMEN ULTRASOUND COMPLETE

[Series 1: us abdomen complete · 0.17mm/px · 14 of 105 slices shown]
[im 1/105]
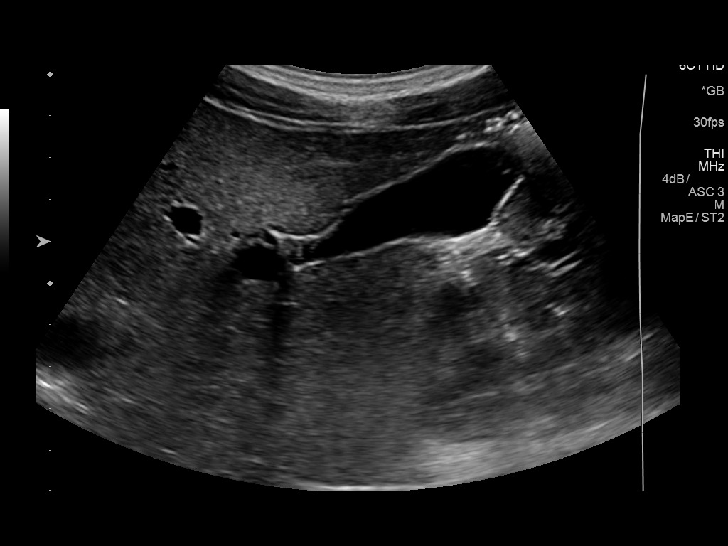
[im 9/105]
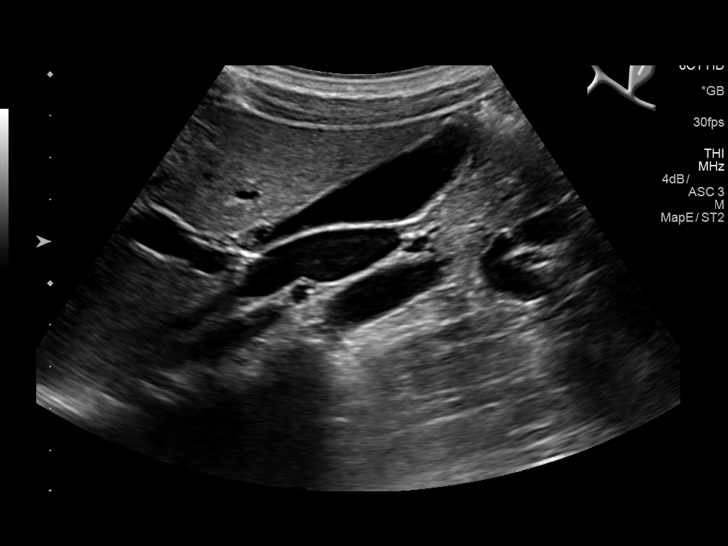
[im 18/105]
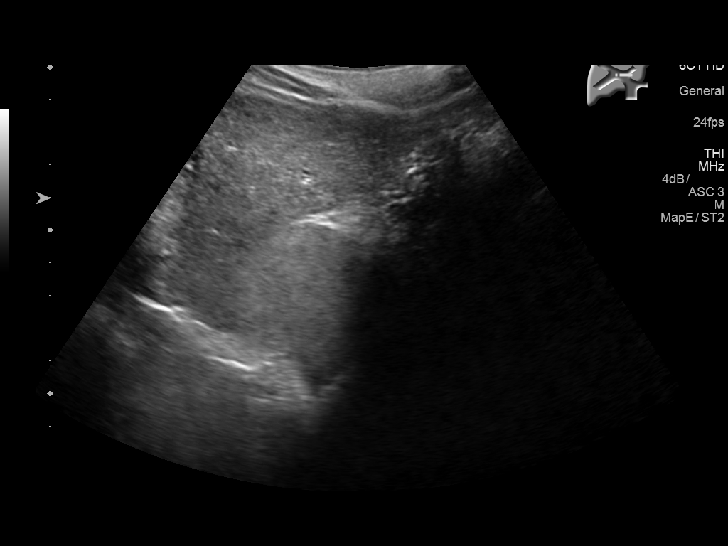
[im 27/105]
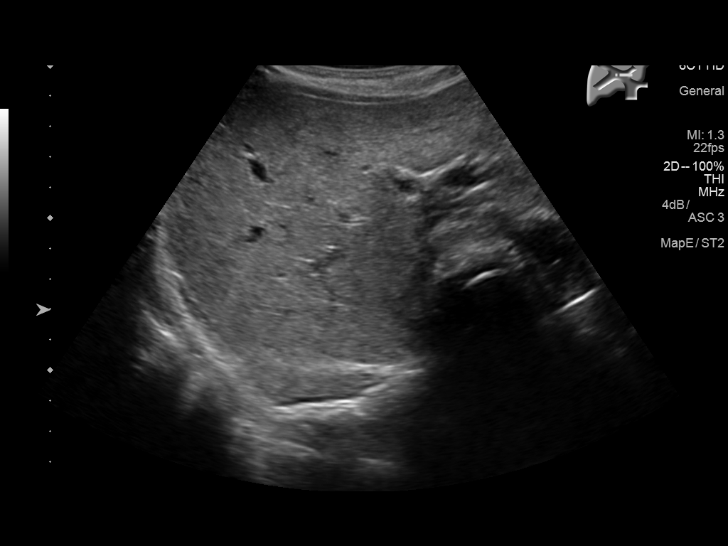
[im 35/105]
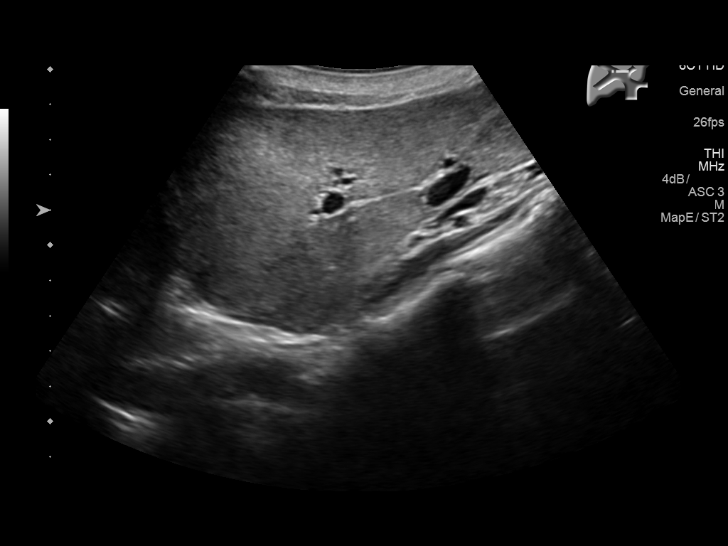
[im 40/105]
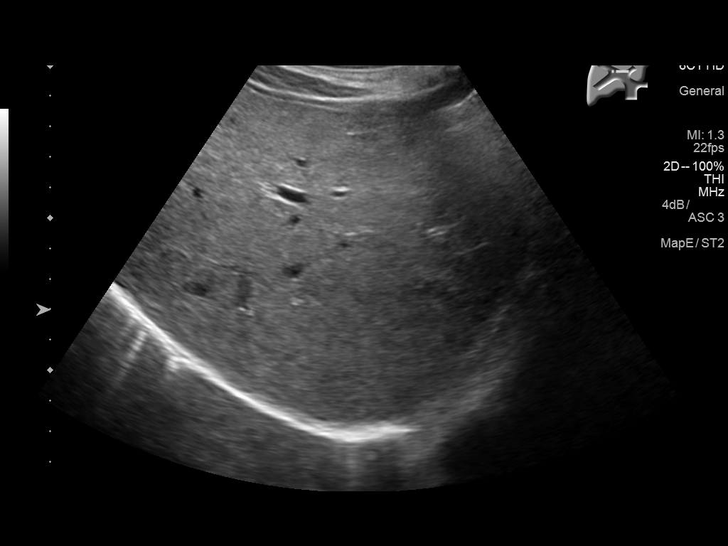
[im 48/105]
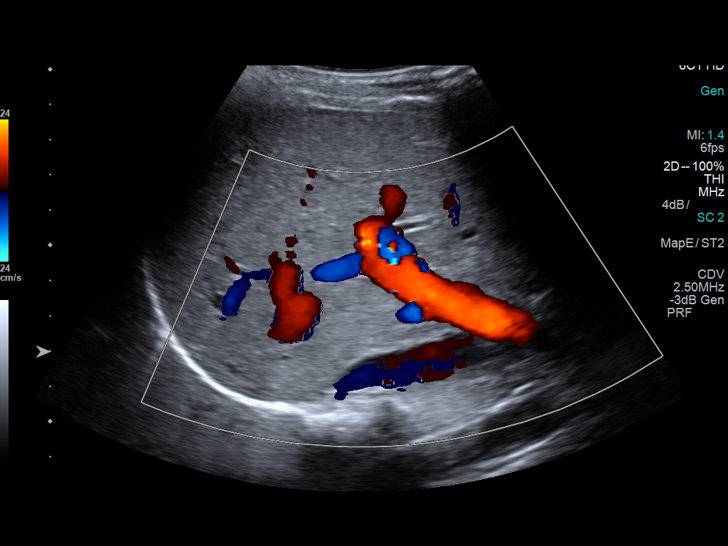
[im 57/105]
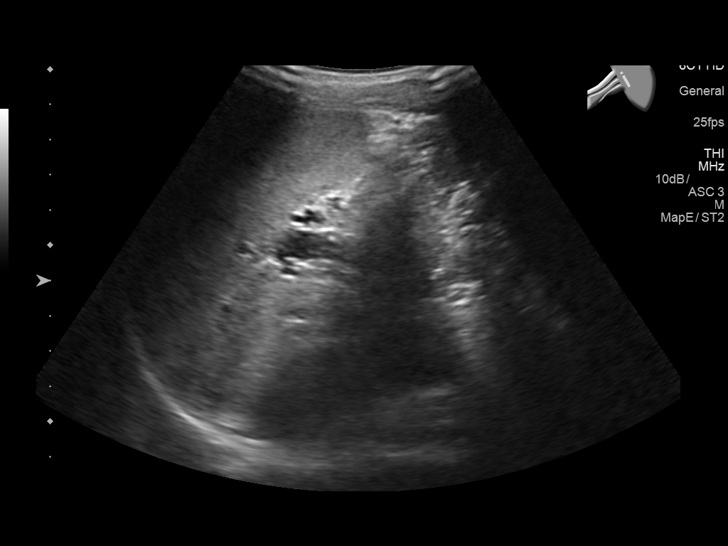
[im 66/105]
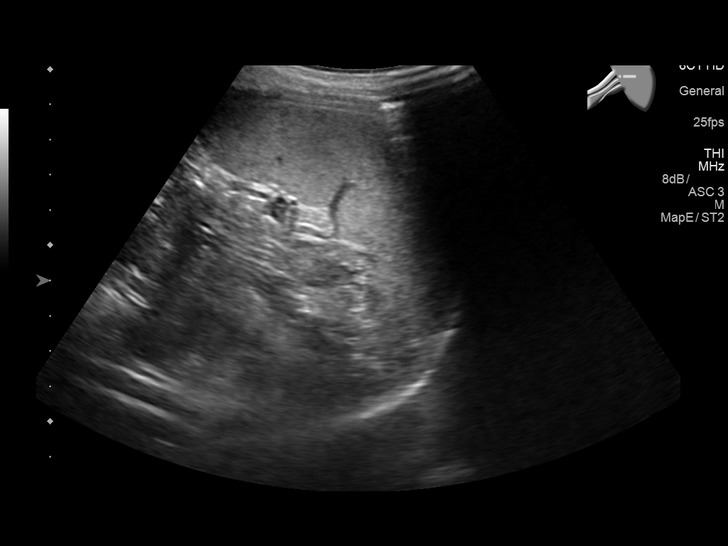
[im 70/105]
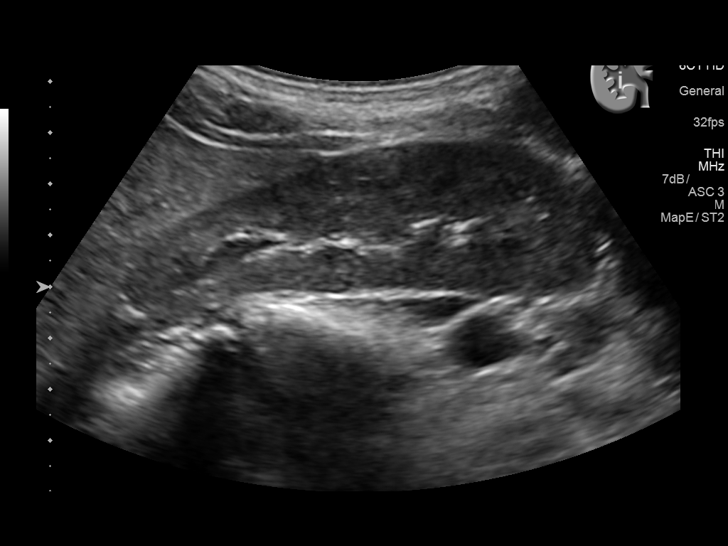
[im 79/105]
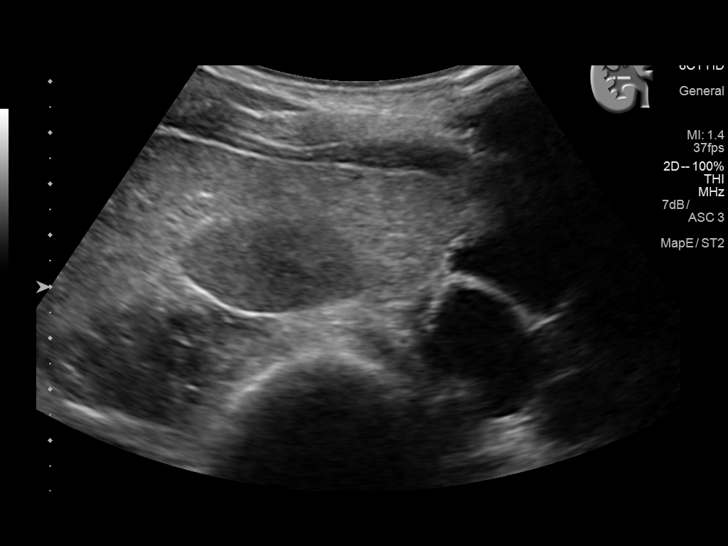
[im 87/105]
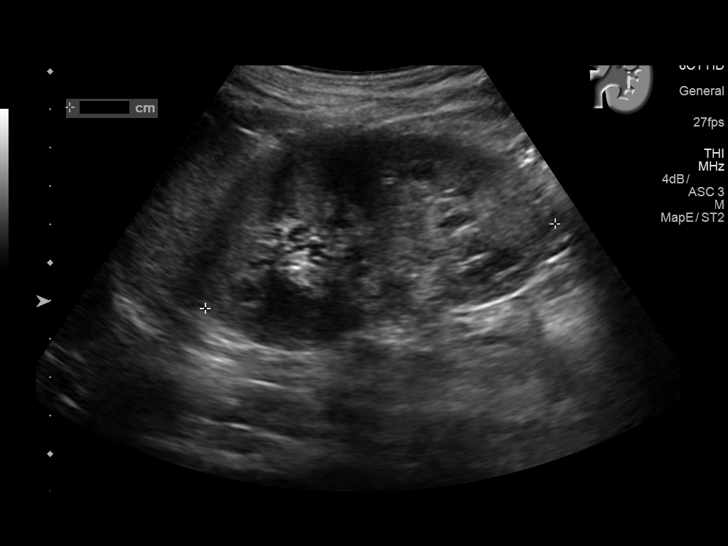
[im 96/105]
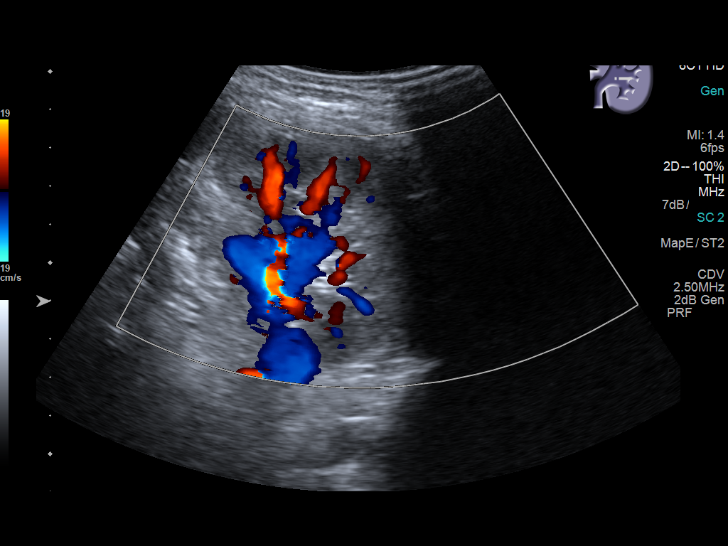
[im 105/105]
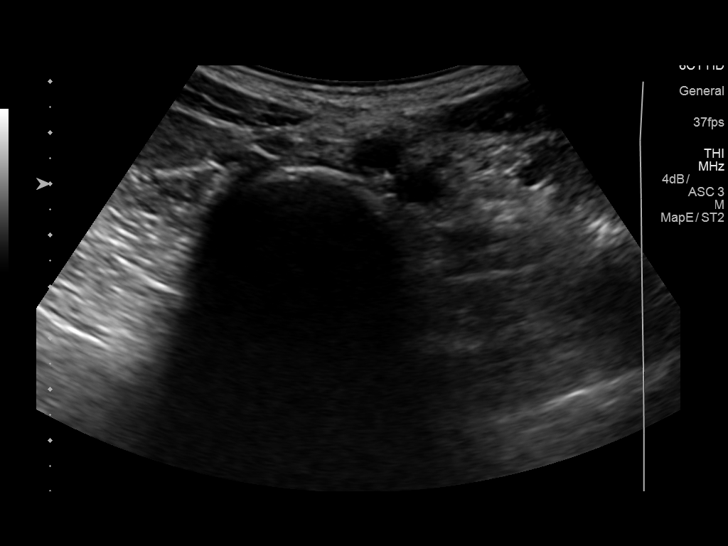

[14 of 25 positions shown; findings below may reference images not displayed]

FINDINGS: Gallbladder: No gallstones or wall thickening visualized. No
sonographic Murphy sign noted by sonographer.

Common bile duct: Diameter: 1.8 mm

Liver: No focal lesion identified. Within normal limits in
parenchymal echogenicity.

IVC: No abnormality visualized.

Pancreas: Visualized portion unremarkable.

Spleen: Size and appearance within normal limits.

Right Kidney: Length: 9.4 cm. Echogenicity within normal limits. No
mass or hydronephrosis visualized.

Left Kidney: Length: 9.4 cm. Echogenicity within normal limits. No
mass or hydronephrosis visualized.

Abdominal aorta: No aneurysm visualized.

Other findings: None.  If symptoms persist CT can be obtained.
IMPRESSION: Negative exam.

## 2017-03-23 ENCOUNTER — Ambulatory Visit: Payer: 59 | Admitting: Women's Health

## 2017-03-23 ENCOUNTER — Other Ambulatory Visit: Payer: Self-pay | Admitting: Women's Health

## 2017-03-23 ENCOUNTER — Encounter: Payer: Self-pay | Admitting: Women's Health

## 2017-03-23 VITALS — BP 134/80

## 2017-03-23 DIAGNOSIS — N898 Other specified noninflammatory disorders of vagina: Secondary | ICD-10-CM

## 2017-03-23 LAB — WET PREP FOR TRICH, YEAST, CLUE

## 2017-03-23 MED ORDER — TERCONAZOLE 0.4 % VA CREA: 1.0000 | TOPICAL_CREAM | Freq: Every day | VAGINAL | 0 refills | Status: DC

## 2017-03-23 NOTE — Progress Notes (Signed)
27 y.o SWF, G0P0 presents with vaginal discharge and mostly external itching, started 4 days ago.  Itching on the external vaginal area. Discharge more than usual. Denies abdominal pain, dysuria, burning with urination, or fever. Seen for Vaginal discharge on 02/22/2017 with negative wet prep and resolve symptoms. ParaGard IUD monthly cycles, same partner. No intercourse in the past month. Plan to go to PA school. 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   Exam: Appears well and stated age. Abdomen soft, nontender no rebound or radiation of discomfort. Extrenal genitalia mild erythema,  vaginal erythema at introitus. Speculum exam scant white discharge with minimal erythema , IUD strings not visible. Wet prep negative.  Vaginal Pruritis   Plan:  terconazole 0.4% vaginal cream, Proper use, apply externally also,. Instructed to call if continued problems.Reassurance given regarding normality of wet prep and exam.

## 2017-03-29 ENCOUNTER — Encounter: Payer: Self-pay | Admitting: Women's Health

## 2017-04-01 DIAGNOSIS — F064 Anxiety disorder due to known physiological condition: Secondary | ICD-10-CM | POA: Diagnosis not present

## 2017-04-08 DIAGNOSIS — F064 Anxiety disorder due to known physiological condition: Secondary | ICD-10-CM | POA: Diagnosis not present

## 2017-04-14 DIAGNOSIS — F064 Anxiety disorder due to known physiological condition: Secondary | ICD-10-CM | POA: Diagnosis not present

## 2017-04-22 DIAGNOSIS — F064 Anxiety disorder due to known physiological condition: Secondary | ICD-10-CM | POA: Diagnosis not present

## 2017-04-29 DIAGNOSIS — F064 Anxiety disorder due to known physiological condition: Secondary | ICD-10-CM | POA: Diagnosis not present

## 2017-05-06 DIAGNOSIS — F064 Anxiety disorder due to known physiological condition: Secondary | ICD-10-CM | POA: Diagnosis not present

## 2017-05-19 DIAGNOSIS — F064 Anxiety disorder due to known physiological condition: Secondary | ICD-10-CM | POA: Diagnosis not present

## 2017-05-26 DIAGNOSIS — F064 Anxiety disorder due to known physiological condition: Secondary | ICD-10-CM | POA: Diagnosis not present

## 2017-06-02 DIAGNOSIS — F064 Anxiety disorder due to known physiological condition: Secondary | ICD-10-CM | POA: Diagnosis not present

## 2017-06-08 ENCOUNTER — Other Ambulatory Visit: Payer: Self-pay | Admitting: Women's Health

## 2017-06-08 ENCOUNTER — Encounter: Payer: Self-pay | Admitting: Women's Health

## 2017-06-08 MED ORDER — MEDROXYPROGESTERONE ACETATE 10 MG PO TABS
10.0000 mg | ORAL_TABLET | Freq: Every day | ORAL | 0 refills | Status: DC
Start: 2017-06-08 — End: 2017-10-17

## 2017-06-10 DIAGNOSIS — F064 Anxiety disorder due to known physiological condition: Secondary | ICD-10-CM | POA: Diagnosis not present

## 2017-06-16 ENCOUNTER — Encounter: Payer: Self-pay | Admitting: Women's Health

## 2017-06-16 DIAGNOSIS — F064 Anxiety disorder due to known physiological condition: Secondary | ICD-10-CM | POA: Diagnosis not present

## 2017-06-23 DIAGNOSIS — F064 Anxiety disorder due to known physiological condition: Secondary | ICD-10-CM | POA: Diagnosis not present

## 2017-06-30 DIAGNOSIS — F064 Anxiety disorder due to known physiological condition: Secondary | ICD-10-CM | POA: Diagnosis not present

## 2017-07-06 DIAGNOSIS — F064 Anxiety disorder due to known physiological condition: Secondary | ICD-10-CM | POA: Diagnosis not present

## 2017-07-07 ENCOUNTER — Ambulatory Visit: Payer: 59 | Admitting: Family Medicine

## 2017-07-13 DIAGNOSIS — F064 Anxiety disorder due to known physiological condition: Secondary | ICD-10-CM | POA: Diagnosis not present

## 2017-07-20 DIAGNOSIS — F064 Anxiety disorder due to known physiological condition: Secondary | ICD-10-CM | POA: Diagnosis not present

## 2017-08-03 DIAGNOSIS — F064 Anxiety disorder due to known physiological condition: Secondary | ICD-10-CM | POA: Diagnosis not present

## 2017-09-01 DIAGNOSIS — F064 Anxiety disorder due to known physiological condition: Secondary | ICD-10-CM | POA: Diagnosis not present

## 2017-09-16 DIAGNOSIS — F064 Anxiety disorder due to known physiological condition: Secondary | ICD-10-CM | POA: Diagnosis not present

## 2017-09-30 DIAGNOSIS — F064 Anxiety disorder due to known physiological condition: Secondary | ICD-10-CM | POA: Diagnosis not present

## 2017-10-14 DIAGNOSIS — H52223 Regular astigmatism, bilateral: Secondary | ICD-10-CM | POA: Diagnosis not present

## 2017-10-17 ENCOUNTER — Ambulatory Visit (INDEPENDENT_AMBULATORY_CARE_PROVIDER_SITE_OTHER): Payer: 59 | Admitting: Women's Health

## 2017-10-17 ENCOUNTER — Encounter: Payer: Self-pay | Admitting: Women's Health

## 2017-10-17 ENCOUNTER — Other Ambulatory Visit: Payer: Self-pay | Admitting: Women's Health

## 2017-10-17 ENCOUNTER — Ambulatory Visit (INDEPENDENT_AMBULATORY_CARE_PROVIDER_SITE_OTHER): Payer: 59

## 2017-10-17 VITALS — BP 120/78 | Ht 59.0 in | Wt 88.0 lb

## 2017-10-17 VITALS — BP 120/78

## 2017-10-17 DIAGNOSIS — N831 Corpus luteum cyst of ovary, unspecified side: Secondary | ICD-10-CM

## 2017-10-17 DIAGNOSIS — T8332XA Displacement of intrauterine contraceptive device, initial encounter: Secondary | ICD-10-CM

## 2017-10-17 DIAGNOSIS — Z30431 Encounter for routine checking of intrauterine contraceptive device: Secondary | ICD-10-CM

## 2017-10-17 DIAGNOSIS — Z113 Encounter for screening for infections with a predominantly sexual mode of transmission: Secondary | ICD-10-CM | POA: Diagnosis not present

## 2017-10-17 DIAGNOSIS — Z01419 Encounter for gynecological examination (general) (routine) without abnormal findings: Secondary | ICD-10-CM | POA: Diagnosis not present

## 2017-10-17 DIAGNOSIS — T8332XD Displacement of intrauterine contraceptive device, subsequent encounter: Secondary | ICD-10-CM

## 2017-10-17 NOTE — Addendum Note (Signed)
Addended by: [name] on: 10/17/2017 03:01 PM   Modules accepted: Orders

## 2017-10-17 NOTE — Patient Instructions (Signed)

## 2017-10-17 NOTE — Progress Notes (Signed)
27 year old S WF G0 presents for ultrasound to check IUD placement., At annual exam  ParaGard IUD strings not visible.  ParaGard IUD placed 10/2016, has had monthly cycles.   Name: Appears well.  Ultrasound: T/V anteverted uterus with IUD seen in normal position.  Right ovary thick-walled cyst internal low level echoes 21 x 9 x 21 mm.  Positive CFD to periphery consistent with cyst.  Left ovary normal.  Fluid seen in cul-de-sac 34 x 30 4 x 23 x 44 mm.  IUD in proper position Small right ovarian cyst  Plan: Ultrasound report reviewed, reassurance given regarding proper placement of IUD.  Asymptomatic cyst will watch at this time.

## 2017-10-17 NOTE — Addendum Note (Signed)
Addended by: Lorine Bears on: 10/17/2017 11:59 AM   Modules accepted: Orders

## 2017-10-17 NOTE — Progress Notes (Signed)
Beth Hayes [birth year] [id number]    History:    Presents for annual exam.  10/2016 ParaGard IUD has a monthly cycle with 2 to 3 days of brown discharge throughout the month.  New partner.  Gardasil series completed.  Normal Pap history. Celiac disease, history of anorexia and bulimia weight stable.  Past medical history, past surgical history, family history and social history were all reviewed and documented in the EPIC chart.  Working at [employer] to attend PA school in [place].  Originally from the area.  Parents are physicians.  ROS:  A ROS was performed and pertinent positives and negatives are included.  Exam:  Vitals:   10/17/17 1054  BP: 120/78  Weight: 88 lb (39.9 kg)  Height: 4' 11"  (1.499 m)   Body mass index is 17.77 kg/m.   General appearance:  Normal Thyroid:  Symmetrical, normal in size, without palpable masses or nodularity. Respiratory  Auscultation:  Clear without wheezing or rhonchi Cardiovascular  Auscultation:  Regular rate, without rubs, murmurs or gallops  Edema/varicosities:  Not grossly evident Abdominal  Soft,nontender, without masses, guarding or rebound.  Liver/spleen:  No organomegaly noted  Hernia:  None appreciated  Skin  Inspection:  Grossly normal   Breasts: Examined lying and sitting.     Right: Without masses, retractions, discharge or axillary adenopathy.     Left: Without masses, retractions, discharge or axillary adenopathy. Gentitourinary   Inguinal/mons:  Normal without inguinal adenopathy  External genitalia:  Normal  BUS/Urethra/Skene's glands:  Normal  Vagina:  Normal  Cervix:  Normal IUD strings not visible  Uterus:   normal in size, shape and contour.  Midline and mobile  Adnexa/parametria:     Rt: Without masses or tenderness.   Lt: Without masses or tenderness.  Anus and perineum: Normal    Assessment/Plan:  27 y.o. S WF G0 for annual exam no complaints.  10/2016 ParaGard IUD with monthly cycle and  occasional brown spotting STD screen Celiac disease primary care manages  Plan: Ultrasound to confirm placement IUD strings not visible.  SBEs, exercise, calcium rich foods, MVI daily encouraged.  CBC, glucose, lipid panel, GC/chlamydia, HIV, hep B, C, RPR.  Pap.  Pap normal 2016, new screening guidelines reviewed.     Beth Hayes [facility], 11:27 AM 10/17/2017

## 2017-10-18 LAB — URINALYSIS, COMPLETE W/RFL CULTURE
BACTERIA UA: NONE SEEN /HPF
Bilirubin Urine: NEGATIVE
Glucose, UA: NEGATIVE
Hgb urine dipstick: NEGATIVE
Hyaline Cast: NONE SEEN /LPF
KETONES UR: NEGATIVE
LEUKOCYTE ESTERASE: NEGATIVE
Nitrites, Initial: NEGATIVE
PH: 5.5 (ref 5.0–8.0)
Protein, ur: NEGATIVE
RBC / HPF: NONE SEEN /HPF (ref 0–2)
Specific Gravity, Urine: 1.021 (ref 1.001–1.03)
Squamous Epithelial / HPF: NONE SEEN /HPF (ref <=5)
WBC, UA: NONE SEEN /HPF (ref 0–5)

## 2017-10-18 LAB — CBC WITH DIFFERENTIAL/PLATELET
BASOS ABS: 28 {cells}/uL (ref 0–200)
Basophils Relative: 0.4 %
EOS ABS: 355 {cells}/uL (ref 15–500)
Eosinophils Relative: 5 %
HCT: 38.3 % (ref 35.0–45.0)
HEMOGLOBIN: 12.9 g/dL (ref 11.7–15.5)
Lymphs Abs: 1626 cells/uL (ref 850–3900)
MCH: 30.1 pg (ref 27.0–33.0)
MCHC: 33.7 g/dL (ref 32.0–36.0)
MCV: 89.3 fL (ref 80.0–100.0)
MONOS PCT: 6.4 %
MPV: 9.5 fL (ref 7.5–12.5)
Neutro Abs: 4636 cells/uL (ref 1500–7800)
Neutrophils Relative %: 65.3 %
Platelets: 265 10*3/uL (ref 140–400)
RBC: 4.29 10*6/uL (ref 3.80–5.10)
RDW: 11.9 % (ref 11.0–15.0)
TOTAL LYMPHOCYTE: 22.9 %
WBC mixed population: 454 cells/uL (ref 200–950)
WBC: 7.1 10*3/uL (ref 3.8–10.8)

## 2017-10-18 LAB — RPR: RPR Ser Ql: NONREACTIVE

## 2017-10-18 LAB — GLUCOSE, RANDOM: GLUCOSE: 86 mg/dL (ref 65–99)

## 2017-10-18 LAB — LIPID PANEL
Cholesterol: 145 mg/dL (ref <200)
HDL: 82 mg/dL (ref >50)
LDL CHOLESTEROL (CALC): 49 mg/dL
Non-HDL Cholesterol (Calc): 63 mg/dL (calc) (ref <130)
Total CHOL/HDL Ratio: 1.8 (calc) (ref <5.0)
Triglycerides: 60 mg/dL (ref <150)

## 2017-10-18 LAB — PAP IG W/ RFLX HPV ASCU

## 2017-10-18 LAB — C. TRACHOMATIS/N. GONORRHOEAE RNA
C. TRACHOMATIS RNA, TMA: NOT DETECTED
N. gonorrhoeae RNA, TMA: NOT DETECTED

## 2017-10-18 LAB — NO CULTURE INDICATED

## 2017-10-18 LAB — HEPATITIS B SURFACE ANTIGEN: Hepatitis B Surface Ag: NONREACTIVE

## 2017-10-18 LAB — HEPATITIS C ANTIBODY
HEP C AB: NONREACTIVE
SIGNAL TO CUT-OFF: 0.05 (ref <1.00)

## 2017-10-18 LAB — HIV ANTIBODY (ROUTINE TESTING W REFLEX): HIV 1&2 Ab, 4th Generation: NONREACTIVE

## 2017-10-28 DIAGNOSIS — F064 Anxiety disorder due to known physiological condition: Secondary | ICD-10-CM | POA: Diagnosis not present

## 2017-11-11 DIAGNOSIS — F064 Anxiety disorder due to known physiological condition: Secondary | ICD-10-CM | POA: Diagnosis not present

## 2017-12-02 DIAGNOSIS — F064 Anxiety disorder due to known physiological condition: Secondary | ICD-10-CM | POA: Diagnosis not present

## 2018-02-28 DIAGNOSIS — F064 Anxiety disorder due to known physiological condition: Secondary | ICD-10-CM | POA: Diagnosis not present

## 2018-03-07 ENCOUNTER — Other Ambulatory Visit: Payer: Self-pay | Admitting: Women's Health

## 2018-03-07 MED ORDER — MEDROXYPROGESTERONE ACETATE 10 MG PO TABS: 10.0000 mg | ORAL_TABLET | Freq: Every day | ORAL | 0 refills | Status: DC

## 2018-03-07 MED FILL — MEDROXYPROGESTERONE 10 MG T: 10 | 10 days supply | Qty: 10 | Fill #0

## 2018-03-14 ENCOUNTER — Encounter: Payer: Self-pay | Admitting: Family Medicine

## 2018-03-21 ENCOUNTER — Encounter: Payer: Self-pay | Admitting: Women's Health

## 2018-03-21 ENCOUNTER — Ambulatory Visit: Payer: 59 | Admitting: Family Medicine

## 2018-03-21 ENCOUNTER — Encounter: Payer: Self-pay | Admitting: Family Medicine

## 2018-03-21 VITALS — BP 110/70 | HR 104 | Temp 98.4°F | Ht 59.0 in | Wt 88.0 lb

## 2018-03-21 DIAGNOSIS — Z7185 Encounter for immunization safety counseling: Secondary | ICD-10-CM

## 2018-03-21 DIAGNOSIS — K900 Celiac disease: Secondary | ICD-10-CM

## 2018-03-21 DIAGNOSIS — F411 Generalized anxiety disorder: Secondary | ICD-10-CM | POA: Diagnosis not present

## 2018-03-21 DIAGNOSIS — Z23 Encounter for immunization: Secondary | ICD-10-CM

## 2018-03-21 DIAGNOSIS — Z7189 Other specified counseling: Secondary | ICD-10-CM

## 2018-03-21 DIAGNOSIS — Z0000 Encounter for general adult medical examination without abnormal findings: Secondary | ICD-10-CM | POA: Diagnosis not present

## 2018-03-21 NOTE — Patient Instructions (Addendum)
BEFORE YOU LEAVE: -tb skin test -lab -follow up: yearly and as needed  Please adhere to a strict Gluten Free diet and see a gastroenterologist once yearly for follow up if doing well.  Eat 3 healthy meals per day and regular snacks.   Preventive Care 18-39 Years, Female Preventive care refers to lifestyle choices and visits with your health care provider that can promote health and wellness. What does preventive care include?   A yearly physical exam. This is also called an annual well check.  Dental exams once or twice a year.  Routine eye exams. Ask your health care provider how often you should have your eyes checked.  Personal lifestyle choices, including: ? Daily care of your teeth and gums. ? Regular physical activity. ? Eating a healthy diet. ? Avoiding tobacco and drug use. ? Limiting alcohol use. ? Practicing safe sex. ? Taking vitamin and mineral supplements as recommended by your health care provider. What happens during an annual well check? The services and screenings done by your health care provider during your annual well check will depend on your age, overall health, lifestyle risk factors, and family history of disease. Counseling Your health care provider may ask you questions about your:  Alcohol use.  Tobacco use.  Drug use.  Emotional well-being.  Home and relationship well-being.  Sexual activity.  Eating habits.  Work and work Statistician.  Method of birth control.  Menstrual cycle.  Pregnancy history. Screening You may have the following tests or measurements:  Height, weight, and BMI.  Diabetes screening. This is done by checking your blood sugar (glucose) after you have not eaten for a while (fasting).  Blood pressure.  Lipid and cholesterol levels. These may be checked every 5 years starting at age 28.  Skin check.  Hepatitis C blood test.  Hepatitis B blood test.  Sexually transmitted disease (STD)  testing.  BRCA-related cancer screening. This may be done if you have a family history of breast, ovarian, tubal, or peritoneal cancers.  Pelvic exam and Pap test. This may be done every 3 years starting at age 12. Starting at age 63, this may be done every 5 years if you have a Pap test in combination with an HPV test. Discuss your test results, treatment options, and if necessary, the need for more tests with your health care provider. Vaccines Your health care provider may recommend certain vaccines, such as:  Influenza vaccine. This is recommended every year.  Tetanus, diphtheria, and acellular pertussis (Tdap, Td) vaccine. You may need a Td booster every 10 years.  Varicella vaccine. You may need this if you have not been vaccinated.  HPV vaccine. If you are 96 or younger, you may need three doses over 6 months.  Measles, mumps, and rubella (MMR) vaccine. You may need at least one dose of MMR. You may also need a second dose.  Pneumococcal 13-valent conjugate (PCV13) vaccine. You may need this if you have certain conditions and were not previously vaccinated.  Pneumococcal polysaccharide (PPSV23) vaccine. You may need one or two doses if you smoke cigarettes or if you have certain conditions.  Meningococcal vaccine. One dose is recommended if you are age 53-21 years and a first-year college student living in a residence hall, or if you have one of several medical conditions. You may also need additional booster doses.  Hepatitis A vaccine. You may need this if you have certain conditions or if you travel or work in places where you may be  exposed to hepatitis A.  Hepatitis B vaccine. You may need this if you have certain conditions or if you travel or work in places where you may be exposed to hepatitis B.  Haemophilus influenzae type b (Hib) vaccine. You may need this if you have certain risk factors. Talk to your health care provider about which screenings and vaccines you need  and how often you need them. This information is not intended to replace advice given to you by your health care provider. Make sure you discuss any questions you have with your health care provider. Document Released: 03/02/2001 Document Revised: 08/17/2016 Document Reviewed: 11/05/2014 Elsevier Interactive Patient Education  2019 [employer].

## 2018-03-21 NOTE — Progress Notes (Signed)
HPI:  Using dictation device. Unfortunately this device frequently misinterprets words/phrases.  Here for CPE:  -Concerns and/or follow up today:   Chronic medical problems summarized below were reviewed for changes. Reports is doing great. Denies any GI symptoms and reports adheres to [employer]. Denies any remaining eating disorder. Feels anxiety has been much better. 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 Needs form completion, TB skin test and hep be serology today. Has had all of her vaccines. Had hep C screening at gyn and shows me the results page on her device. She denies any chronic cough, SOB, malaise or exposure to high risk areas for TB.  -Diet: variety of foods, balance and well rounded, larger portion sizes -Exercise: no regular exercise -Taking folic acid, vitamin D or calcium: no -Diabetes and Dyslipidemia Screening: n/a -Vaccines: see vaccine section EPIC -pap history: see gyn -FDLMP: see nursing notes -sexual activity: yes - on protection, sees gyn -wants STI testing (Hep C if born [birth year]-65): no - reports done with gyn recently -FH breast, colon or ovarian ca: see FH Last mammogram: n/a Last colon cancer screening: n/a Breast Ca Risk Assessment: see family history and pt history DEXA (>/= 65): n/a  -Alcohol, Tobacco, drug use: see social history  Review of Systems - no fevers, unintentional weight loss, vision loss, hearing loss, chest pain, sob, hemoptysis, melena, hematochezia, hematuria, genital discharge, changing or concerning skin lesions, bleeding, bruising, loc, thoughts of self harm or SI  Past Medical History:  Diagnosis Date  . Allergy   . Anorexia   . Bulimia   . Celiac disease   . Celiac sprue     Past Surgical History:  Procedure Laterality Date  . ADENOIDECTOMY  01/03/2012   Procedure: ADENOIDECTOMY;  Surgeon: Jodi Marble, MD;  Location: [facility];  Service: ENT;  Laterality: N/A;  . BIOPSY BOWEL  age 60 mos. and age 63  .  BIOPSY BOWEL     small intestine 2002  . INCISION AND DRAINAGE     [id number] by Dr Erik Obey  . INCISION AND DRAINAGE OF PERITONSILLAR ABCESS  11/26/2010   was not a tonsillectomy  . INTRAUTERINE DEVICE INSERTION  10/28/2016   Paraguard  . TONSILLECTOMY  11/26/2010   Procedure: TONSILLECTOMY;  Surgeon: Tyson Alias;  Location: [facility];  Service: ENT;  Laterality: Right;  I & D right peritonsillar abscess  . TONSILLECTOMY  01/03/2012   Procedure: TONSILLECTOMY;  Surgeon: Jodi Marble, MD;  Location: [facility];  Service: ENT;  Laterality: N/A;  . TONSILLECTOMY AND ADENOIDECTOMY  2013    Family History  Problem Relation Age of Onset  . Cancer Paternal Grandfather        pancreatic  . Alcohol abuse Paternal Grandfather   . Heart disease Paternal Grandmother        a. fib  . Mental illness Other        schizophrenia  . Other Other        fibromuscular dysplasia renal artery    Social History   Socioeconomic History  . Marital status: Single    Spouse name: Not on file  . Number of children: Not on file  . Years of education: Not on file  . Highest education level: Not on file  Occupational History  . Not on file  Social Needs  . Financial resource strain: Not on file  . Food insecurity:    Worry: Not on file    Inability: Not on  file  . Transportation needs:    Medical: Not on file    Non-medical: Not on file  Tobacco Use  . Smoking status: Former Smoker    Types: E-cigarettes    Last attempt to quit: 09/28/2015    Years since quitting: 2.4  . Smokeless tobacco: Never Used  . Tobacco comment: Vape  Substance and Sexual Activity  . Alcohol use: Yes    Alcohol/week: 0.0 standard drinks    Comment: Rare  . Drug use: No  . Sexual activity: Yes    Partners: Male    Birth control/protection: None, I.U.D.    Comment: 1st intercourse 28 yo-More than 5 partners ParaGard IUD 10/28/2016  Lifestyle  . Physical activity:    Days per  week: Not on file    Minutes per session: Not on file  . Stress: Not on file  Relationships  . Social connections:    Talks on phone: Not on file    Gets together: Not on file    Attends religious service: Not on file    Active member of club or organization: Not on file    Attends meetings of clubs or organizations: Not on file    Relationship status: Not on file  Other Topics Concern  . Not on file  Social History Narrative   Work or Merchandiser, retail - interested in becoming provider      Home Situation: lives with alone and with parents      Spiritual Beliefs: none      Lifestyle: no regular exercise; diet is healthy     Current Outpatient Medications:  .  medroxyPROGESTERone (PROVERA) 10 MG tablet, Take 1 tablet (10 mg total) by mouth daily., Disp: 10 tablet, Rfl: 0 .  PARAGARD INTRAUTERINE COPPER IU, by Intrauterine route., Disp: , Rfl:  .  triamcinolone (NASACORT) 55 MCG/ACT AERO nasal inhaler, Place 2 sprays into the nose daily., Disp: 1 Inhaler, Rfl: 0  EXAM:  Vitals:   03/21/18 1457  BP: 110/70  Pulse: (!) 104  Temp: 98.4 F (36.9 C)    GENERAL: vitals reviewed and listed below, alert, oriented, appears well hydrated and in no acute distress  HEENT: head atraumatic, PERRLA, normal appearance of eyes, ears, nose and mouth. moist mucus membranes.  NECK: supple, no masses or lymphadenopathy  LUNGS: clear to auscultation bilaterally, no rales, rhonchi or wheeze  CV: HRRR, no peripheral edema or cyanosis, normal pedal pulses  ABDOMEN: bowel sounds normal, soft, non tender to palpation, no masses, no rebound or guarding  GU/BREAST: declined  SKIN: no rash or abnormal lesions, declined full skin exam  MS: normal gait, moves all extremities normally  NEURO: normal gait, speech and thought processing grossly intact, muscle tone grossly intact throughout  PSYCH: normal affect, pleasant and cooperative  ASSESSMENT AND PLAN:  Discussed the  following assessment and plan:  PREVENTIVE EXAM: -Discussed and advised all [place] preventive services health task force level A and B recommendations for age, sex and risks. -Advised at least 150 minutes of exercise per week and a healthy diet with avoidance of (less then 1 serving per week) processed foods, white starches, red meat, fast foods and sweets and consisting of: * 5-9 servings of fresh fruits and vegetables (not corn or potatoes) *nuts and seeds, beans *olives and olive oil *lean meats such as fish and white chicken  *whole grains -labs, studies and vaccines per orders this encounter -filled out form as able and gave to assistant to complete and return  to patient once results return  Patient advised to return to clinic immediately if symptoms worsen or persist or new concerns.  Patient Instructions  BEFORE YOU LEAVE: -tb skin test -lab -follow up: yearly and as needed  Please adhere to a strict Gluten Free diet and see a gastroenterologist once yearly for follow up if doing well.  Eat 3 healthy meals per day and regular snacks.   Preventive Care 18-39 Years, Female Preventive care refers to lifestyle choices and visits with your health care provider that can promote health and wellness. What does preventive care include?   A yearly physical exam. This is also called an annual well check.  Dental exams once or twice a year.  Routine eye exams. Ask your health care provider how often you should have your eyes checked.  Personal lifestyle choices, including: ? Daily care of your teeth and gums. ? Regular physical activity. ? Eating a healthy diet. ? Avoiding tobacco and drug use. ? Limiting alcohol use. ? Practicing safe sex. ? Taking vitamin and mineral supplements as recommended by your health care provider. What happens during an annual well check? The services and screenings done by your health care provider during your annual well check will depend on your  age, overall health, lifestyle risk factors, and family history of disease. Counseling Your health care provider may ask you questions about your:  Alcohol use.  Tobacco use.  Drug use.  Emotional well-being.  Home and relationship well-being.  Sexual activity.  Eating habits.  Work and work Statistician.  Method of birth control.  Menstrual cycle.  Pregnancy history. Screening You may have the following tests or measurements:  Height, weight, and BMI.  Diabetes screening. This is done by checking your blood sugar (glucose) after you have not eaten for a while (fasting).  Blood pressure.  Lipid and cholesterol levels. These may be checked every 5 years starting at age 60.  Skin check.  Hepatitis C blood test.  Hepatitis B blood test.  Sexually transmitted disease (STD) testing.  BRCA-related cancer screening. This may be done if you have a family history of breast, ovarian, tubal, or peritoneal cancers.  Pelvic exam and Pap test. This may be done every 3 years starting at age 53. Starting at age 16, this may be done every 5 years if you have a Pap test in combination with an HPV test. Discuss your test results, treatment options, and if necessary, the need for more tests with your health care provider. Vaccines Your health care provider may recommend certain vaccines, such as:  Influenza vaccine. This is recommended every year.  Tetanus, diphtheria, and acellular pertussis (Tdap, Td) vaccine. You may need a Td booster every 10 years.  Varicella vaccine. You may need this if you have not been vaccinated.  HPV vaccine. If you are 62 or younger, you may need three doses over 6 months.  Measles, mumps, and rubella (MMR) vaccine. You may need at least one dose of MMR. You may also need a second dose.  Pneumococcal 13-valent conjugate (PCV13) vaccine. You may need this if you have certain conditions and were not previously vaccinated.  Pneumococcal  polysaccharide (PPSV23) vaccine. You may need one or two doses if you smoke cigarettes or if you have certain conditions.  Meningococcal vaccine. One dose is recommended if you are age 54-21 years and a first-year college student living in a residence hall, or if you have one of several medical conditions. You may also need additional booster  doses.  Hepatitis A vaccine. You may need this if you have certain conditions or if you travel or work in places where you may be exposed to hepatitis A.  Hepatitis B vaccine. You may need this if you have certain conditions or if you travel or work in places where you may be exposed to hepatitis B.  Haemophilus influenzae type b (Hib) vaccine. You may need this if you have certain risk factors. Talk to your health care provider about which screenings and vaccines you need and how often you need them. This information is not intended to replace advice given to you by your health care provider. Make sure you discuss any questions you have with your health care provider. Document Released: 03/02/2001 Document Revised: 08/17/2016 Document Reviewed: 11/05/2014 Elsevier Interactive Patient Education  2019 [employer].      No follow-ups on file.  Beth Kern, DO

## 2018-03-23 LAB — TB SKIN TEST
INDURATION: 0 mm
TB Skin Test: NEGATIVE

## 2018-03-31 DIAGNOSIS — F064 Anxiety disorder due to known physiological condition: Secondary | ICD-10-CM | POA: Diagnosis not present

## 2018-05-17 ENCOUNTER — Encounter: Payer: Self-pay | Admitting: Family Medicine

## 2018-06-21 ENCOUNTER — Encounter: Payer: Self-pay | Admitting: Family Medicine

## 2018-06-24 IMAGING — CR DG CHEST 2V
2 series · 2 of 2 positions shown · non-contrast
Comparison: None.

CLINICAL DATA: Tachycardia.

EXAM:
CHEST  2 VIEW

[w chest pa]
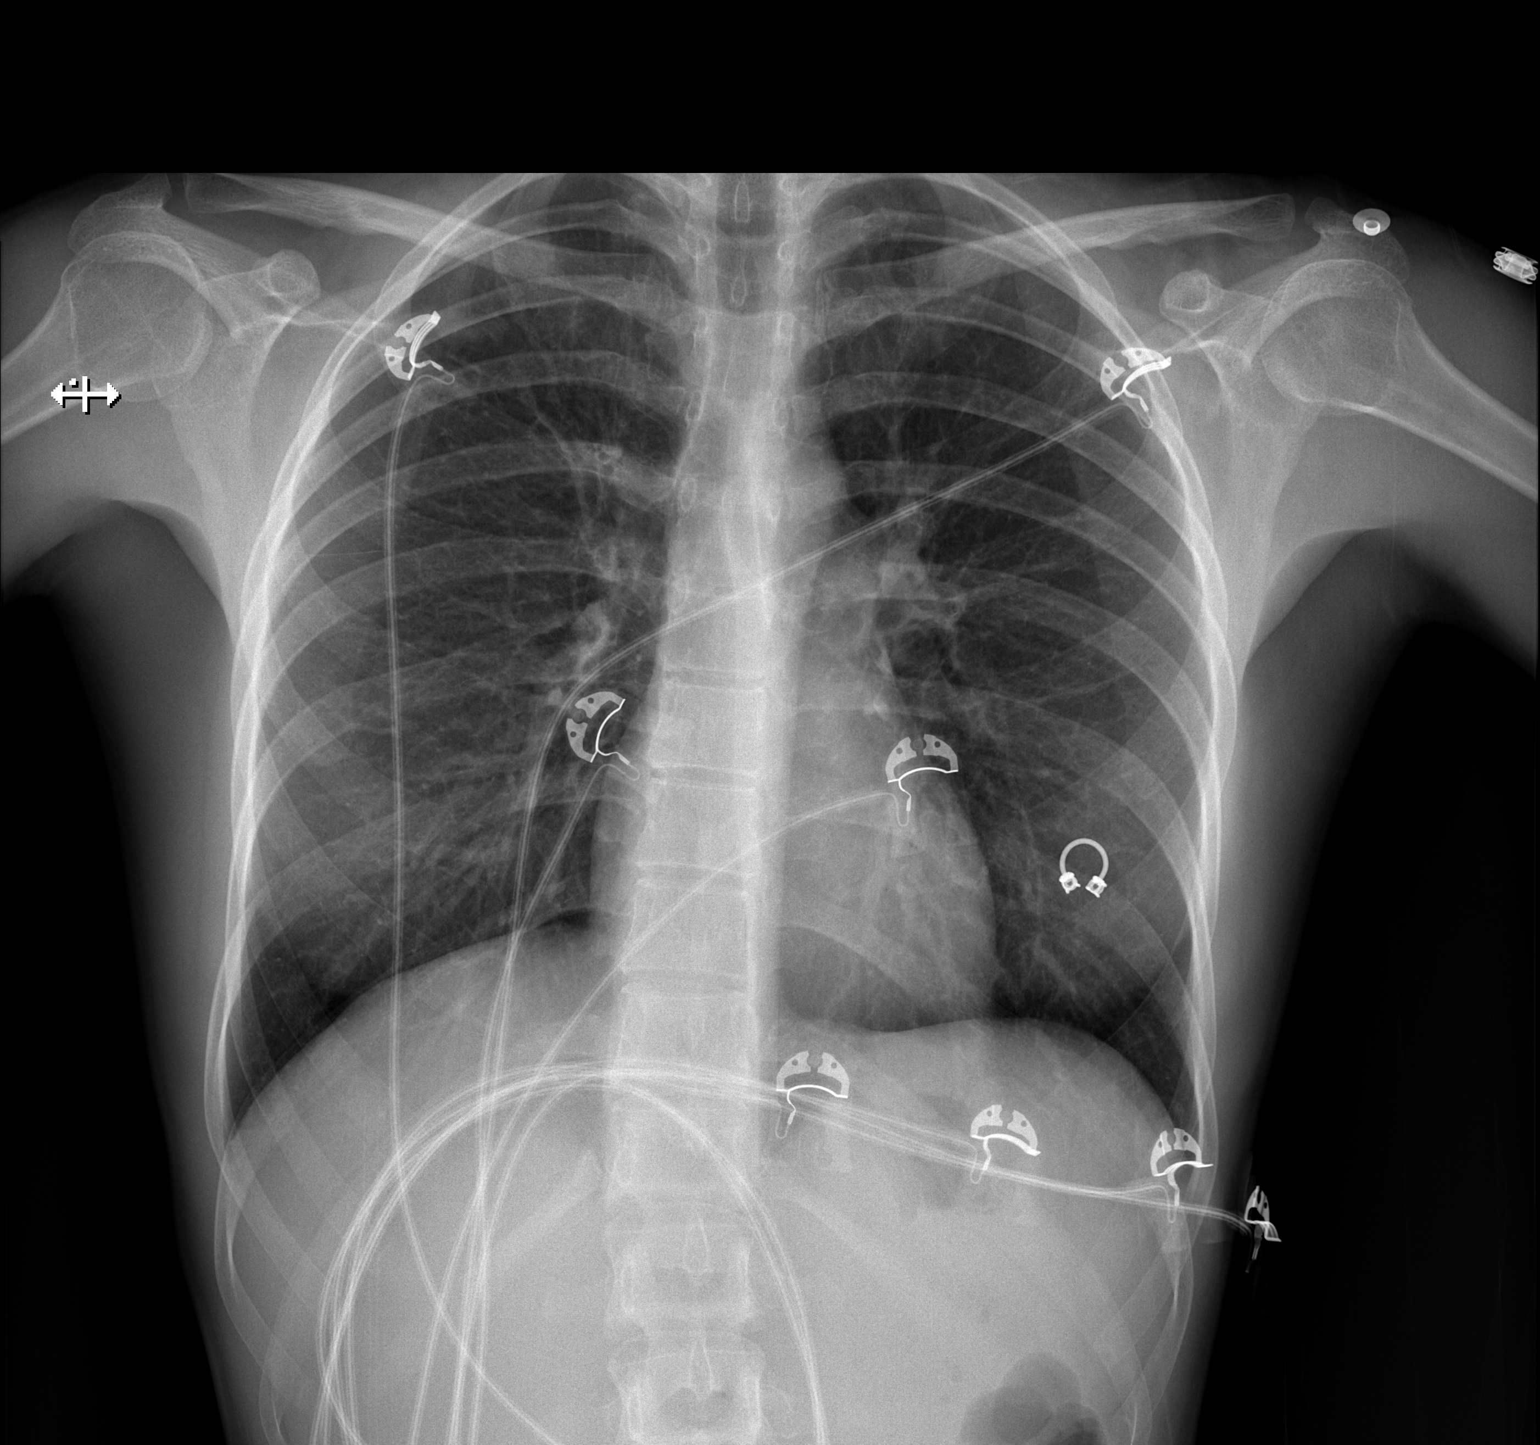

[w chest lat]
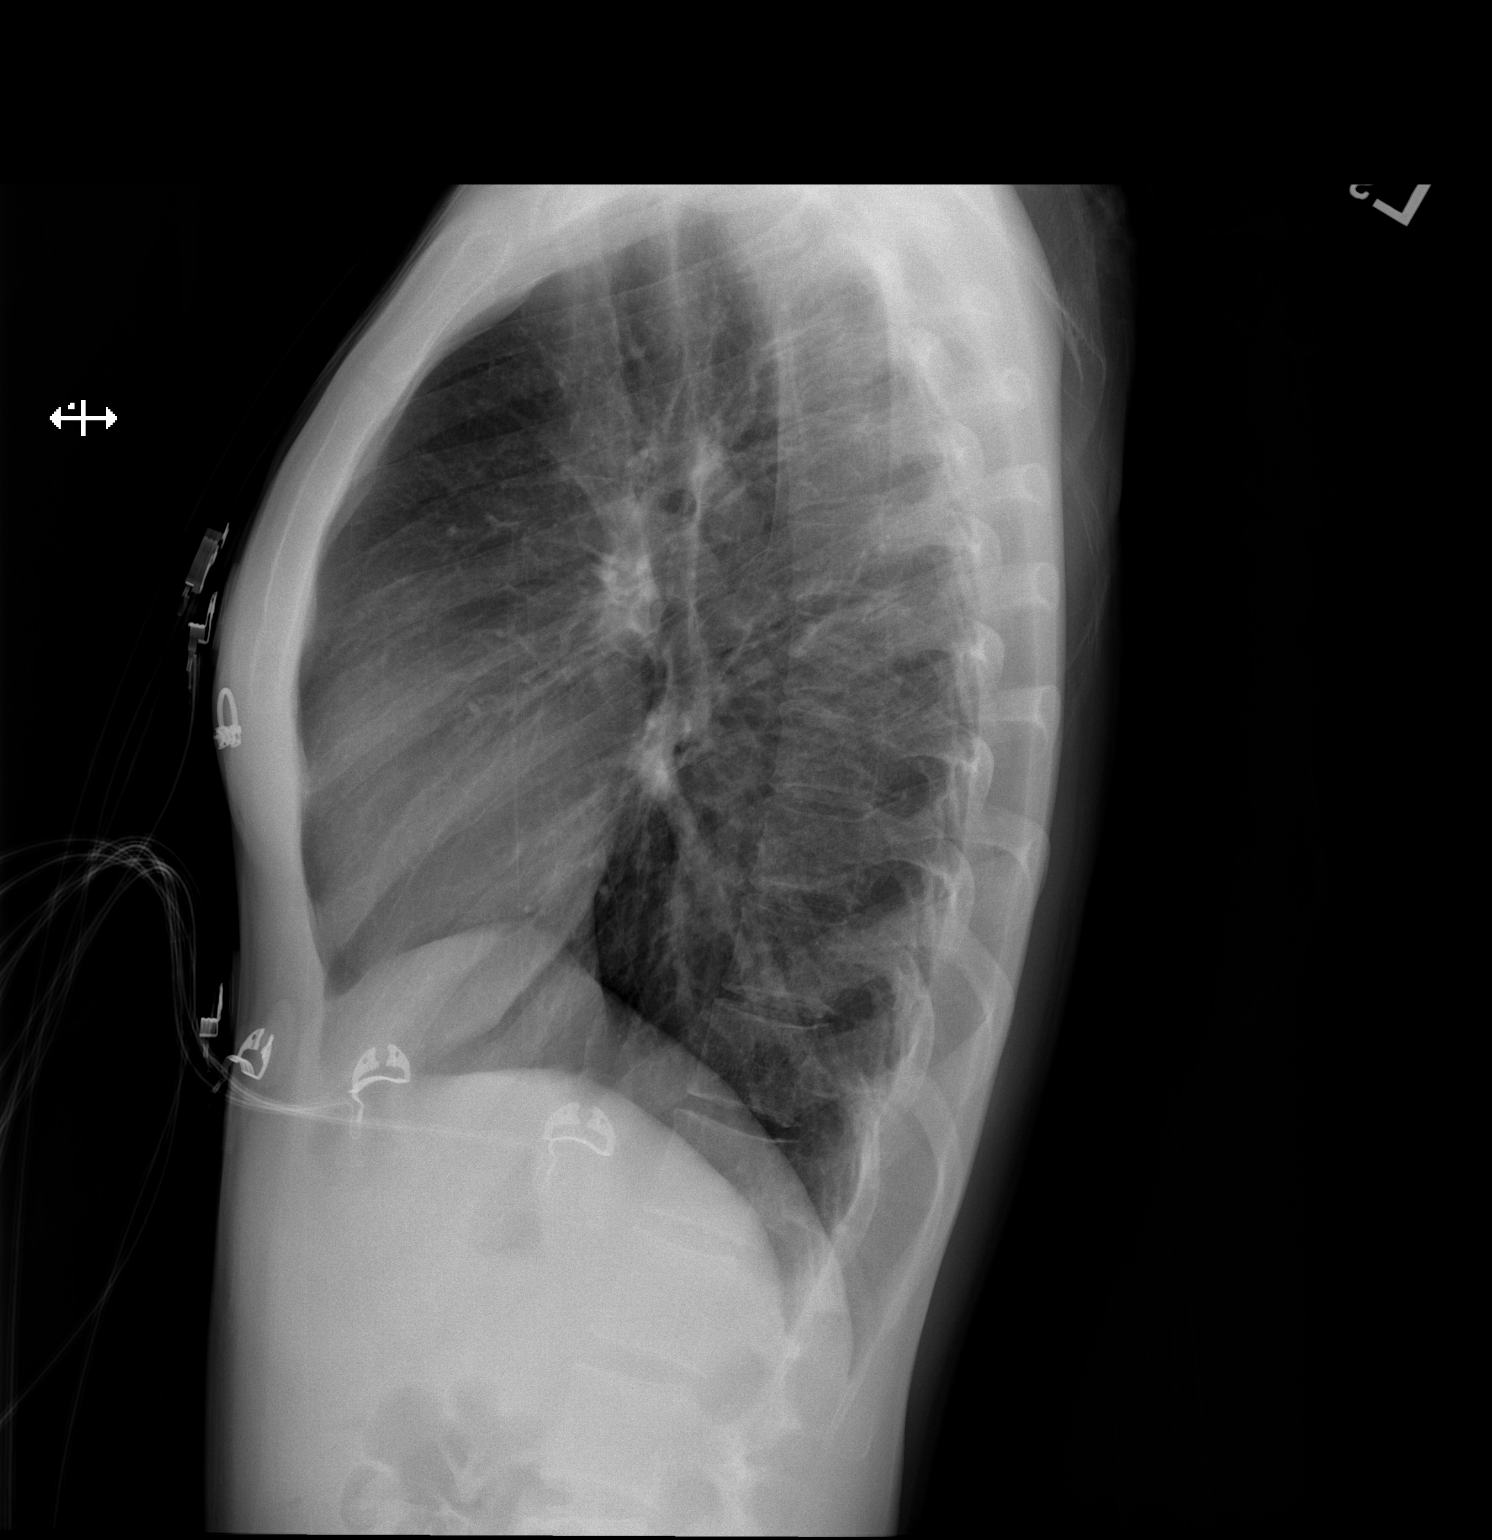

[2 of 2 positions shown; findings below may reference images not displayed]

FINDINGS: Normal heart size and mediastinal contours. No infiltrate or edema.
No effusion or pneumothorax. No osseous findings.
IMPRESSION: Negative chest.

## 2018-06-30 ENCOUNTER — Encounter: Payer: Self-pay | Admitting: Women's Health

## 2018-07-03 DIAGNOSIS — F064 Anxiety disorder due to known physiological condition: Secondary | ICD-10-CM | POA: Diagnosis not present

## 2018-07-03 DIAGNOSIS — F411 Generalized anxiety disorder: Secondary | ICD-10-CM | POA: Diagnosis not present

## 2018-07-03 DIAGNOSIS — F4322 Adjustment disorder with anxiety: Secondary | ICD-10-CM | POA: Diagnosis not present

## 2018-08-05 DIAGNOSIS — F411 Generalized anxiety disorder: Secondary | ICD-10-CM | POA: Diagnosis not present

## 2018-08-05 DIAGNOSIS — F4322 Adjustment disorder with anxiety: Secondary | ICD-10-CM | POA: Diagnosis not present

## 2018-08-19 ENCOUNTER — Encounter: Payer: Self-pay | Admitting: Family Medicine

## 2018-08-21 NOTE — Telephone Encounter (Signed)
Glad to hear of quitting smoking!  Please schedule an appointment with Dr. Maudie Mercury. Thanks!

## 2018-08-31 ENCOUNTER — Telehealth (INDEPENDENT_AMBULATORY_CARE_PROVIDER_SITE_OTHER): Payer: 59 | Admitting: Family Medicine

## 2018-08-31 ENCOUNTER — Other Ambulatory Visit: Payer: Self-pay

## 2018-08-31 DIAGNOSIS — F17203 Nicotine dependence unspecified, with withdrawal: Secondary | ICD-10-CM

## 2018-08-31 MED ORDER — NICOTINE 14 MG/24HR TD PT24: 14.0000 mg | MEDICATED_PATCH | Freq: Every day | TRANSDERMAL | 0 refills | Status: DC

## 2018-08-31 MED ORDER — BUPROPION HCL ER (SR) 150 MG PO TB12: 150.0000 mg | ORAL_TABLET | Freq: Every day | ORAL | 0 refills | Status: DC

## 2018-08-31 NOTE — Progress Notes (Signed)
Virtual Visit via Video Note  I connected with Beth Hayes  on 08/31/18 at  5:20 PM EDT by a video enabled telemedicine application and verified that I am speaking with the correct person using two identifiers.  Location patient: home Location provider:work or home office Persons participating in the virtual visit: patient, provider  I discussed the limitations of evaluation and management by telemedicine and the availability of in person appointments. The patient expressed understanding and agreed to proceed.   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  Past Medical History:  Diagnosis Date  . Allergy   . Anorexia   . Bulimia   . Celiac disease   . Celiac sprue     Past Surgical History:  Procedure Laterality Date  . ADENOIDECTOMY  01/03/2012   Procedure: ADENOIDECTOMY;  Surgeon: Jodi Marble, MD;  Location: [facility];  Service: ENT;  Laterality: N/A;  . BIOPSY BOWEL  age 25 mos. and age 38  . BIOPSY BOWEL     small intestine 2002  . INCISION AND DRAINAGE     [id number] by Dr Erik Obey  . INCISION AND DRAINAGE OF PERITONSILLAR ABCESS  11/26/2010   was not a tonsillectomy  . INTRAUTERINE DEVICE INSERTION  10/28/2016   Paraguard  . TONSILLECTOMY  11/26/2010   Procedure: TONSILLECTOMY;  Surgeon: Tyson Alias;  Location:  [facility];  Service: ENT;  Laterality: Right;  I & D right peritonsillar abscess  . TONSILLECTOMY  01/03/2012   Procedure: TONSILLECTOMY;  Surgeon: Jodi Marble, MD;  Location: [address] [place];  Service: ENT;  Laterality: N/A;  . TONSILLECTOMY AND ADENOIDECTOMY  2013    Family History  Problem Relation Age of Onset  . Cancer Paternal Grandfather        pancreatic  . Alcohol abuse Paternal Grandfather   . Heart disease Paternal Grandmother        a. fib  . Mental illness Other        schizophrenia  . Other Other        fibromuscular dysplasia renal artery    SOCIAL HX: see hpi   Current Outpatient Medications:  .  buPROPion (WELLBUTRIN SR) 150 MG 12 hr tablet, Take 1 tablet (150 mg total) by mouth daily., Disp: 90 tablet, Rfl: 0 .  medroxyPROGESTERone (PROVERA) 10 MG tablet, Take 1 tablet (10 mg total) by mouth daily., Disp: 10 tablet, Rfl: 0 .  nicotine (NICODERM CQ - DOSED IN MG/24 HOURS) 14 mg/24hr patch, Place 1 patch (14 mg total) onto the skin daily., Disp: 14 patch, Rfl: 0 .  PARAGARD INTRAUTERINE COPPER IU, by Intrauterine route., Disp: , Rfl:  .  triamcinolone (NASACORT) 55 MCG/ACT AERO nasal inhaler, Place 2 sprays into the nose daily., Disp: 1 Inhaler, Rfl: 0  EXAM:  VITALS per patient if applicable:  GENERAL: alert, oriented, appears well and in no acute distress  HEENT: atraumatic, conjunttiva clear, no obvious abnormalities on inspection of external nose and ears  NECK: normal movements of the head and neck  LUNGS: on inspection no signs of respiratory distress, breathing rate appears normal, no obvious gross SOB, gasping or wheezing  CV: no obvious cyanosis  MS: moves all visible extremities without noticeable abnormality  PSYCH/NEURO: pleasant and cooperative, no obvious depression or anxiety, speech and thought processing grossly intact  ASSESSMENT AND PLAN:  Discussed the following assessment and plan:  Nicotine withdrawal   Nicotine Dependency  Discussed options for the treatment of withdraw and risks. Discussed issue of anxiety as well. She opted to try patches until her break and then start Wellbutrin. Agrees to seek care immediately if any side effects on the Wellbutrin. Follow up in 3-4 weeks.   I discussed the assessment and treatment plan with the patient. The patient was provided an opportunity to ask questions and all were answered. The patient agreed with the plan and demonstrated an understanding of the instructions.   The patient was advised to call back or seek an in-person evaluation if the symptoms worsen or if the condition fails to improve as anticipated.   Lucretia Kern, DO

## 2018-09-14 ENCOUNTER — Encounter: Payer: Self-pay | Admitting: Family Medicine

## 2018-09-15 ENCOUNTER — Telehealth: Payer: Self-pay | Admitting: *Deleted

## 2018-09-15 NOTE — Telephone Encounter (Signed)
Left a message for the pt to return my call.  CRM also created.

## 2018-09-15 NOTE — Telephone Encounter (Signed)
Copied from [facility] [id number]. Topic: Quick Sport and exercise psychologist Patient (Clinic Use ONLY) >> Sep 15, 2018  9:39 AM Agnes Lawrence, CMA wrote: Reason for CRM: see note from PCP regarding symptoms >> Sep 15, 2018  9:48 AM Leward Quan A wrote: Patient would like a call back from Pricilla Holm please can be reached at Ph# [phone]

## 2018-09-15 NOTE — Telephone Encounter (Signed)
See My chart message

## 2018-09-15 NOTE — Telephone Encounter (Signed)
Patient informed of the message below.  Stated she is feeling fine,looked up side effects and noticed this may be normal, declined earlier appt and will await the visit on 9/3.

## 2018-09-21 ENCOUNTER — Telehealth (INDEPENDENT_AMBULATORY_CARE_PROVIDER_SITE_OTHER): Payer: Self-pay | Admitting: Family Medicine

## 2018-09-21 ENCOUNTER — Encounter: Payer: Self-pay | Admitting: Family Medicine

## 2018-09-21 ENCOUNTER — Other Ambulatory Visit: Payer: Self-pay

## 2018-09-21 DIAGNOSIS — F17290 Nicotine dependence, other tobacco product, uncomplicated: Secondary | ICD-10-CM

## 2018-09-21 DIAGNOSIS — Z716 Tobacco abuse counseling: Secondary | ICD-10-CM

## 2018-09-21 NOTE — Progress Notes (Signed)
Virtual Visit via Video Note  I connected with Beth Hayes  on 09/21/18 at  4:00 PM EDT by a video enabled telemedicine application and verified that I am speaking with the correct person using two identifiers.  Location patient: home Location provider:work or home office Persons participating in the virtual visit: patient, provider  I discussed the limitations of evaluation and management by telemedicine and the availability of in person appointments. The patient expressed understanding and agreed to proceed.   HPI:  Follow up smoking cessation: -she used the patch first -now is using wellbutrin - started about 2 weeks ago -it has worked well to curb the cravings - has not vaped at all -brother and boyfriend may try to quit too -she had some shaking and some cog fog when quit, mild, nothing intolerable   ROS: See pertinent positives and negatives per HPI.  Past Medical History:  Diagnosis Date  . Allergy   . Anorexia   . Bulimia   . Celiac disease   . Celiac sprue     Past Surgical History:  Procedure Laterality Date  . ADENOIDECTOMY  01/03/2012   Procedure: ADENOIDECTOMY;  Surgeon: Jodi Marble, MD;  Location: [facility];  Service: ENT;  Laterality: N/A;  . BIOPSY BOWEL  age 41 mos. and age 37  . BIOPSY BOWEL     small intestine 2002  . INCISION AND DRAINAGE     [id number] by Dr Erik Obey  . INCISION AND DRAINAGE OF PERITONSILLAR ABCESS  11/26/2010   was not a tonsillectomy  . INTRAUTERINE DEVICE INSERTION  10/28/2016   Paraguard  . TONSILLECTOMY  11/26/2010   Procedure: TONSILLECTOMY;  Surgeon: Tyson Alias;  Location: [facility];  Service: ENT;  Laterality: Right;  I & D right peritonsillar abscess  . TONSILLECTOMY  01/03/2012   Procedure: TONSILLECTOMY;  Surgeon: Jodi Marble, MD;  Location: [facility];  Service: ENT;  Laterality: N/A;  . TONSILLECTOMY AND ADENOIDECTOMY  2013    Family History  Problem Relation Age of  Onset  . Cancer Paternal Grandfather        pancreatic  . Alcohol abuse Paternal Grandfather   . Heart disease Paternal Grandmother        a. fib  . Mental illness Other        schizophrenia  . Other Other        fibromuscular dysplasia renal artery    SOCIAL HX: see hpi   Current Outpatient Medications:  .  buPROPion (WELLBUTRIN SR) 150 MG 12 hr tablet, Take 1 tablet (150 mg total) by mouth daily., Disp: 90 tablet, Rfl: 0 .  medroxyPROGESTERone (PROVERA) 10 MG tablet, Take 1 tablet (10 mg total) by mouth daily., Disp: 10 tablet, Rfl: 0 .  nicotine (NICODERM CQ - DOSED IN MG/24 HOURS) 14 mg/24hr patch, Place 1 patch (14 mg total) onto the skin daily., Disp: 14 patch, Rfl: 0 .  PARAGARD INTRAUTERINE COPPER IU, by Intrauterine route., Disp: , Rfl:  .  triamcinolone (NASACORT) 55 MCG/ACT AERO nasal inhaler, Place 2 sprays into the nose daily., Disp: 1 Inhaler, Rfl: 0  EXAM:  VITALS per patient if applicable:  GENERAL: alert, oriented, appears well and in no acute distress  HEENT: atraumatic, conjunttiva clear, no obvious abnormalities on inspection of external nose and ears  NECK: normal movements of the head and neck  LUNGS: on inspection no signs of respiratory distress, breathing rate appears normal, no obvious gross SOB, gasping or wheezing  CV:  no obvious cyanosis  MS: moves all visible extremities without noticeable abnormality  PSYCH/NEURO: pleasant and cooperative, no obvious depression or anxiety, speech and thought processing grossly intact  ASSESSMENT AND PLAN:  Discussed the following assessment and plan:  Encounter for smoking cessation counseling  Other tobacco product nicotine dependence, uncomplicated  Doing well. She will finish out the 90 days of Wellbutrin. She had been using the 46m patch and Wellbutrin, advised to stop the patch. Discussed potential for some cravings, but should be able to hopefully overcome them with the Wellbutrin on board. I'm  excited that her brother and boyfriend may quit as well as this will make this easier for her too. Follow up as needed for this.   I discussed the assessment and treatment plan with the patient. The patient was provided an opportunity to ask questions and all were answered. The patient agreed with the plan and demonstrated an understanding of the instructions.   The patient was advised to call back or seek an in-person evaluation if the symptoms worsen or if the condition fails to improve as anticipated.   HLucretia Kern DO

## 2018-10-03 ENCOUNTER — Encounter: Payer: Self-pay | Admitting: Women's Health

## 2018-10-09 ENCOUNTER — Encounter: Payer: Self-pay | Admitting: Gynecology

## 2018-10-17 DIAGNOSIS — F411 Generalized anxiety disorder: Secondary | ICD-10-CM | POA: Diagnosis not present

## 2018-10-17 DIAGNOSIS — F4322 Adjustment disorder with anxiety: Secondary | ICD-10-CM | POA: Diagnosis not present

## 2018-11-01 ENCOUNTER — Other Ambulatory Visit: Payer: Self-pay

## 2018-11-01 DIAGNOSIS — Z20822 Contact with and (suspected) exposure to covid-19: Secondary | ICD-10-CM

## 2018-11-02 LAB — NOVEL CORONAVIRUS, NAA: SARS-CoV-2, NAA: NOT DETECTED

## 2018-11-07 ENCOUNTER — Encounter: Payer: 59 | Admitting: Women's Health

## 2018-11-14 ENCOUNTER — Other Ambulatory Visit: Payer: Self-pay

## 2018-11-15 ENCOUNTER — Encounter: Payer: Self-pay | Admitting: Women's Health

## 2018-11-15 ENCOUNTER — Ambulatory Visit (INDEPENDENT_AMBULATORY_CARE_PROVIDER_SITE_OTHER): Payer: 59 | Admitting: Women's Health

## 2018-11-15 VITALS — BP 120/80 | Ht 59.0 in | Wt 87.0 lb

## 2018-11-15 DIAGNOSIS — Z01419 Encounter for gynecological examination (general) (routine) without abnormal findings: Secondary | ICD-10-CM

## 2018-11-15 DIAGNOSIS — N898 Other specified noninflammatory disorders of vagina: Secondary | ICD-10-CM | POA: Diagnosis not present

## 2018-11-15 DIAGNOSIS — Z23 Encounter for immunization: Secondary | ICD-10-CM

## 2018-11-15 DIAGNOSIS — N920 Excessive and frequent menstruation with regular cycle: Secondary | ICD-10-CM

## 2018-11-15 LAB — WET PREP FOR TRICH, YEAST, CLUE

## 2018-11-15 MED ORDER — FLUCONAZOLE 150 MG PO TABS: 150.0000 mg | ORAL_TABLET | Freq: Once | ORAL | 1 refills | Status: AC

## 2018-11-15 MED ORDER — MEGESTROL ACETATE 20 MG PO TABS: ORAL_TABLET | ORAL | 0 refills | Status: DC

## 2018-11-15 NOTE — Patient Instructions (Signed)
Good to see you, good luck with school, you can do it!  Health Maintenance, Female Adopting a healthy lifestyle and getting preventive care are important in promoting health and wellness. Ask your health care provider about:  The right schedule for you to have regular tests and exams.  Things you can do on your own to prevent diseases and keep yourself healthy. What should I know about diet, weight, and exercise? Eat a healthy diet   Eat a diet that includes plenty of vegetables, fruits, low-fat dairy products, and lean protein.  Do not eat a lot of foods that are high in solid fats, added sugars, or sodium. Maintain a healthy weight Body mass index (BMI) is used to identify weight problems. It estimates body fat based on height and weight. Your health care provider can help determine your BMI and help you achieve or maintain a healthy weight. Get regular exercise Get regular exercise. This is one of the most important things you can do for your health. Most adults should:  Exercise for at least 150 minutes each week. The exercise should increase your heart rate and make you sweat (moderate-intensity exercise).  Do strengthening exercises at least twice a week. This is in addition to the moderate-intensity exercise.  Spend less time sitting. Even light physical activity can be beneficial. Watch cholesterol and blood lipids Have your blood tested for lipids and cholesterol at 26 years of age, then have this test every 5 years. Have your cholesterol levels checked more often if:  Your lipid or cholesterol levels are high.  You are older than 26 years of age.  You are at high risk for heart disease. What should I know about cancer screening? Depending on your health history and family history, you may need to have cancer screening at various ages. This may include screening for:  Breast cancer.  Cervical cancer.  Colorectal cancer.  Skin cancer.  Lung cancer. What should I  know about heart disease, diabetes, and high blood pressure? Blood pressure and heart disease  High blood pressure causes heart disease and increases the risk of stroke. This is more likely to develop in people who have high blood pressure readings, are of African descent, or are overweight.  Have your blood pressure checked: ? Every 3-5 years if you are 39-92 years of age. ? Every year if you are 11 years old or older. Diabetes Have regular diabetes screenings. This checks your fasting blood sugar level. Have the screening done:  Once every three years after age 37 if you are at a normal weight and have a low risk for diabetes.  More often and at a younger age if you are overweight or have a high risk for diabetes. What should I know about preventing infection? Hepatitis B If you have a higher risk for hepatitis B, you should be screened for this virus. Talk with your health care provider to find out if you are at risk for hepatitis B infection. Hepatitis C Testing is recommended for:  Everyone born from [birth year] through 1963.  Anyone with known risk factors for hepatitis C. Sexually transmitted infections (STIs)  Get screened for STIs, including gonorrhea and chlamydia, if: ? You are sexually active and are younger than 26 years of age. ? You are older than 26 years of age and your health care provider tells you that you are at risk for this type of infection. ? Your sexual activity has changed since you were last screened, and you are  at increased risk for chlamydia or gonorrhea. Ask your health care provider if you are at risk.  Ask your health care provider about whether you are at high risk for HIV. Your health care provider may recommend a prescription medicine to help prevent HIV infection. If you choose to take medicine to prevent HIV, you should first get tested for HIV. You should then be tested every 3 months for as long as you are taking the medicine. Pregnancy  If you are  about to stop having your period (premenopausal) and you may become pregnant, seek counseling before you get pregnant.  Take 400 to 800 micrograms (mcg) of folic acid every day if you become pregnant.  Ask for birth control (contraception) if you want to prevent pregnancy. Osteoporosis and menopause Osteoporosis is a disease in which the bones lose minerals and strength with aging. This can result in bone fractures. If you are 18 years old or older, or if you are at risk for osteoporosis and fractures, ask your health care provider if you should:  Be screened for bone loss.  Take a calcium or vitamin D supplement to lower your risk of fractures.  Be given hormone replacement therapy (HRT) to treat symptoms of menopause. Follow these instructions at home: Lifestyle  Do not use any products that contain nicotine or tobacco, such as cigarettes, e-cigarettes, and chewing tobacco. If you need help quitting, ask your health care provider.  Do not use street drugs.  Do not share needles.  Ask your health care provider for help if you need support or information about quitting drugs. Alcohol use  Do not drink alcohol if: ? Your health care provider tells you not to drink. ? You are pregnant, may be pregnant, or are planning to become pregnant.  If you drink alcohol: ? Limit how much you use to 0-1 drink a day. ? Limit intake if you are breastfeeding.  Be aware of how much alcohol is in your drink. In the [place] one drink equals one 12 oz bottle of beer (355 mL), one 5 oz glass of wine (148 mL), or one 1 oz glass of hard liquor (44 mL). General instructions  Schedule regular health, dental, and eye exams.  Stay current with your vaccines.  Tell your health care provider if: ? You often feel depressed. ? You have ever been abused or do not feel safe at home. Summary  Adopting a healthy lifestyle and getting preventive care are important in promoting health and wellness.  Follow  your health care provider's instructions about healthy diet, exercising, and getting tested or screened for diseases.  Follow your health care provider's instructions on monitoring your cholesterol and blood pressure. This information is not intended to replace advice given to you by your health care provider. Make sure you discuss any questions you have with your health care provider. Document Released: 07/20/2010 Document Revised: 12/28/2017 Document Reviewed: 12/28/2017 Elsevier Patient Education  2020 [employer].

## 2018-11-15 NOTE — Progress Notes (Signed)
Beth Hayes 03-24-1990 [id number]    History:    Presents for annual exam.  10/2016 ParaGard IUD monthly heavy cycles sometimes lasting longer than 7 days and occasional brown mucousy spotting throughout the month.  Negative STD screen with current partner.  Underweight, has always been thin history of an eating disorder, reports all family members are thin.  Celiac disease.  Normal Pap history.  Gardasil series completed.  In medical school.  Past medical history, past surgical history, family history and social history were all reviewed and documented in the EPIC chart.  ROS:  A ROS was performed and pertinent positives and negatives are included.  Exam:  Vitals:   11/15/18 1240  BP: 120/80  Weight: 87 lb (39.5 kg)  Height: 4' 11"  (1.499 m)   Body mass index is 17.57 kg/m.   General appearance:  Normal Thyroid:  Symmetrical, normal in size, without palpable masses or nodularity. Respiratory  Auscultation:  Clear without wheezing or rhonchi Cardiovascular  Auscultation:  Regular rate, without rubs, murmurs or gallops  Edema/varicosities:  Not grossly evident Abdominal  Soft,nontender, without masses, guarding or rebound.  Liver/spleen:  No organomegaly noted  Hernia:  None appreciated  Skin  Inspection:  Grossly normal   Breasts: Examined lying and sitting.     Right: Without masses, retractions, discharge or axillary adenopathy.     Left: 1 cm nodule 2 o'clock position nontender  noretractions, discharge or axillary adenopathy. Gentitourinary   Inguinal/mons:  Normal without inguinal adenopathy  External genitalia:  Normal  BUS/Urethra/Skene's glands:  Normal  Vagina:  Normal  Cervix:  Normal IUD string visible  Uterus:  normal in size, shape and contour.  Midline and mobile  Adnexa/parametria:     Rt: Without masses or tenderness.   Lt: Without masses or tenderness.  Anus and perineum: Normal  Digital rectal exam: Normal sphincter tone without palpated  masses or tenderness  Assessment/Plan:  28 y.o. S WF G0 for annual exam with heavy cycles, brown spotting occasionally.  Yeast vaginitis 10/2016 ParaGard IUD Celiac disease History of anxiety/depression currently on no medication  Plan: Diflucan 150 p.o. x1 dose, instructed to call if continued problems with brown spotting.  SBEs, has a 1 cm nontender nodule on left outer breast 2 o'clock position will get ultrasound.  Reviewed most likely lymph node.  No family history of breast cancer.  Self-care, leisure activities encouraged.  Counseling as needed.  CBC, TSH, Pap normal 2019, new screening guidelines reviewed.    Bureau, 12:49 PM 11/15/2018

## 2018-11-16 ENCOUNTER — Encounter: Payer: Self-pay | Admitting: Family Medicine

## 2018-11-16 ENCOUNTER — Telehealth: Payer: Self-pay | Admitting: *Deleted

## 2018-11-16 DIAGNOSIS — N63 Unspecified lump in unspecified breast: Secondary | ICD-10-CM

## 2018-11-16 LAB — URINALYSIS, COMPLETE W/RFL CULTURE
Bacteria, UA: NONE SEEN /HPF
Bilirubin Urine: NEGATIVE
Glucose, UA: NEGATIVE
Hgb urine dipstick: NEGATIVE
Hyaline Cast: NONE SEEN /LPF
Ketones, ur: NEGATIVE
Leukocyte Esterase: NEGATIVE
Nitrites, Initial: NEGATIVE
Protein, ur: NEGATIVE
RBC / HPF: NONE SEEN /HPF (ref 0–2)
Specific Gravity, Urine: 1.005 (ref 1.001–1.03)
WBC, UA: NONE SEEN /HPF (ref 0–5)
pH: >=8.5 — AB (ref 5.0–8.0)

## 2018-11-16 LAB — CBC WITH DIFFERENTIAL/PLATELET
Absolute Monocytes: 311 cells/uL (ref 200–950)
Basophils Absolute: 18 cells/uL (ref 0–200)
Basophils Relative: 0.4 %
Eosinophils Absolute: 180 cells/uL (ref 15–500)
Eosinophils Relative: 4 %
HCT: 35 % (ref 35.0–45.0)
Hemoglobin: 11.2 g/dL — ABNORMAL LOW (ref 11.7–15.5)
Lymphs Abs: 1485 cells/uL (ref 850–3900)
MCH: 26.2 pg — ABNORMAL LOW (ref 27.0–33.0)
MCHC: 32 g/dL (ref 32.0–36.0)
MCV: 82 fL (ref 80.0–100.0)
MPV: 9.7 fL (ref 7.5–12.5)
Monocytes Relative: 6.9 %
Neutro Abs: 2507 cells/uL (ref 1500–7800)
Neutrophils Relative %: 55.7 %
Platelets: 307 10*3/uL (ref 140–400)
RBC: 4.27 10*6/uL (ref 3.80–5.10)
RDW: 14.2 % (ref 11.0–15.0)
Total Lymphocyte: 33 %
WBC: 4.5 10*3/uL (ref 3.8–10.8)

## 2018-11-16 LAB — TSH: TSH: 2.05 mIU/L

## 2018-11-16 LAB — NO CULTURE INDICATED

## 2018-11-16 NOTE — Telephone Encounter (Signed)
Orders placed at breast center

## 2018-11-16 NOTE — Telephone Encounter (Signed)
-----   Message from [name], NP sent at 11/15/2018  1:08 PM EDT ----- Has a 1 cm ? Lymph node on left breast at 2:00  [place] at breast center  anytime after next Monday is good.

## 2018-11-17 ENCOUNTER — Telehealth: Payer: Self-pay

## 2018-11-17 NOTE — Telephone Encounter (Signed)
I called the pt and informed her I did not call her.

## 2018-11-17 NOTE — Progress Notes (Signed)
Call pt and left a message to call back to make a appt

## 2018-11-17 NOTE — Telephone Encounter (Signed)
Copied from [place] [id number]. Topic: General - Other >> Nov 17, 2018 12:08 PM Keene Breath wrote: Reason for CRM: Patient is returning a call from the office.  She is not sure whey she got the call.  Please discuss and call patient back at [phone]

## 2018-11-20 ENCOUNTER — Encounter: Payer: Self-pay | Admitting: *Deleted

## 2018-11-20 NOTE — Telephone Encounter (Signed)
Patient called scheduled on 11/27/18 @ 12:15 at the breast center of [facility]. Sent my chart message with time and date.

## 2018-11-24 ENCOUNTER — Other Ambulatory Visit: Payer: Self-pay

## 2018-11-24 ENCOUNTER — Telehealth (INDEPENDENT_AMBULATORY_CARE_PROVIDER_SITE_OTHER): Payer: Self-pay | Admitting: Internal Medicine

## 2018-11-24 DIAGNOSIS — Z720 Tobacco use: Secondary | ICD-10-CM

## 2018-11-24 DIAGNOSIS — K900 Celiac disease: Secondary | ICD-10-CM

## 2018-11-24 DIAGNOSIS — R590 Localized enlarged lymph nodes: Secondary | ICD-10-CM

## 2018-11-24 NOTE — Progress Notes (Signed)
Virtual Visit via Video Note  I connected with Beth Hayes on 11/24/18 at  7:30 AM EST by a video enabled telemedicine application and verified that I am speaking with the correct person using two identifiers.  Location patient: home Location provider: work office Persons participating in the virtual visit: patient, provider  I discussed the limitations of evaluation and management by telemedicine and the availability of in person appointments. The patient expressed understanding and agreed to proceed.   HPI:  This is a scheduled visit to establish care. 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 She has 3 siblings. PMH is significant for Celiac Sprue. She used to vape but has now quit. No longer using Wellbutrin. She has had a left axillary LN since Feb and her GYN has scheduled her for an [place] next week. Her PGF had pancreatic cancer.   ROS: Constitutional: Denies fever, chills, diaphoresis, appetite change and fatigue.  HEENT: Denies photophobia, eye pain, redness, hearing loss, ear pain, congestion, sore throat, rhinorrhea, sneezing, mouth sores, trouble swallowing, neck pain, neck stiffness and tinnitus.   Respiratory: Denies SOB, DOE, cough, chest tightness,  and wheezing.   Cardiovascular: Denies chest pain, palpitations and leg swelling.  Gastrointestinal: Denies nausea, vomiting, abdominal pain, diarrhea, constipation, blood in stool and abdominal distention.  Genitourinary: Denies dysuria, urgency, frequency, hematuria, flank pain and difficulty urinating.  Endocrine: Denies: hot or cold intolerance, sweats, changes in hair or nails, polyuria, polydipsia. Musculoskeletal: Denies myalgias, back pain, joint swelling, arthralgias and gait problem.  Skin: Denies pallor, rash and wound.  Neurological: Denies dizziness, seizures, syncope, weakness, light-headedness, numbness and headaches.  Hematological:  Denies adenopathy. Easy bruising, personal or family bleeding history  Psychiatric/Behavioral: Denies suicidal ideation, mood changes, confusion, nervousness, sleep disturbance and agitation   Past Medical History:  Diagnosis Date  . Allergy   . Anorexia   . Bulimia   . Celiac disease   . Celiac sprue     Past Surgical History:  Procedure Laterality Date  . ADENOIDECTOMY  01/03/2012   Procedure: ADENOIDECTOMY;  Surgeon: Jodi Marble, MD;  Location: [facility];  Service: ENT;  Laterality: N/A;  . BIOPSY BOWEL  age 75 mos. and age 40  . BIOPSY BOWEL     small intestine 2002  . INCISION AND DRAINAGE     [id number] by Dr Erik Obey  . INCISION AND DRAINAGE OF PERITONSILLAR ABCESS  11/26/2010   was not a tonsillectomy  . INTRAUTERINE DEVICE INSERTION  10/28/2016   Paraguard  . TONSILLECTOMY  11/26/2010   Procedure: TONSILLECTOMY;  Surgeon: Tyson Alias;  Location: [facility];  Service: ENT;  Laterality: Right;  I & D right peritonsillar abscess  . TONSILLECTOMY  01/03/2012   Procedure: TONSILLECTOMY;  Surgeon: Jodi Marble, MD;  Location: [facility];  Service: ENT;  Laterality: N/A;  . TONSILLECTOMY AND ADENOIDECTOMY  2013    Family History  Problem Relation Age of Onset  . Cancer Paternal Grandfather        pancreatic  . Alcohol abuse Paternal Grandfather   . Heart disease Paternal Grandmother        a. fib  . Mental illness Other        schizophrenia  . Other Other        fibromuscular dysplasia renal artery    SOCIAL HX:   reports that she quit smoking about  2 months ago. Her smoking use included e-cigarettes. She has never used smokeless tobacco. She reports current alcohol use. She reports that she does not use drugs.   Current Outpatient Medications:  .  megestrol (MEGACE) 20 MG tablet, Take twice daily as needed, Disp: 30 tablet, Rfl: 0 .  PARAGARD INTRAUTERINE COPPER IU, by Intrauterine route., Disp: , Rfl:  .   triamcinolone (NASACORT) 55 MCG/ACT AERO nasal inhaler, Place 2 sprays into the nose daily., Disp: 1 Inhaler, Rfl: 0  EXAM:   VITALS per patient if applicable: none reported  GENERAL: alert, oriented, appears well and in no acute distress  HEENT: atraumatic, conjunttiva clear, no obvious abnormalities on inspection of external nose and ears, wears corrective lenses  NECK: normal movements of the head and neck  LUNGS: on inspection no signs of respiratory distress, breathing rate appears normal, no obvious gross increased work of breathing, gasping or wheezing  CV: no obvious cyanosis  MS: moves all visible extremities without noticeable abnormality  PSYCH/NEURO: pleasant and cooperative, no obvious depression or anxiety, speech and thought processing grossly intact  ASSESSMENT AND PLAN:   Celiac sprue -Controlled with glutten free diet.  Tobacco abuse -She used to vape but has recently quit.  Axillary lymphadenopathy -For [place] next week.    I discussed the assessment and treatment plan with the patient. The patient was provided an opportunity to ask questions and all were answered. The patient agreed with the plan and demonstrated an understanding of the instructions.   The patient was advised to call back or seek an in-person evaluation if the symptoms worsen or if the condition fails to improve as anticipated.    Beth Frohlich, MD  [facility] at [facility]

## 2018-11-27 ENCOUNTER — Other Ambulatory Visit: Payer: Self-pay

## 2018-11-27 ENCOUNTER — Other Ambulatory Visit: Payer: Self-pay | Admitting: Family Medicine

## 2018-11-27 ENCOUNTER — Ambulatory Visit
Admission: RE | Admit: 2018-11-27 | Discharge: 2018-11-27 | Disposition: A | Discharge status: Home / Self Care | Payer: 59 | Source: Ambulatory Visit | Attending: Women's Health | Admitting: Women's Health

## 2018-11-27 DIAGNOSIS — N63 Unspecified lump in unspecified breast: Secondary | ICD-10-CM

## 2018-12-11 ENCOUNTER — Ambulatory Visit: Payer: 59 | Admitting: Women's Health

## 2018-12-11 ENCOUNTER — Other Ambulatory Visit: Payer: 59

## 2018-12-17 ENCOUNTER — Encounter: Payer: Self-pay | Admitting: Internal Medicine

## 2018-12-28 ENCOUNTER — Other Ambulatory Visit: Payer: Self-pay

## 2018-12-28 DIAGNOSIS — Z20822 Contact with and (suspected) exposure to covid-19: Secondary | ICD-10-CM

## 2018-12-30 LAB — NOVEL CORONAVIRUS, NAA: SARS-CoV-2, NAA: NOT DETECTED

## 2019-01-11 ENCOUNTER — Other Ambulatory Visit: Payer: 59

## 2019-01-15 ENCOUNTER — Other Ambulatory Visit: Payer: Self-pay

## 2019-01-15 ENCOUNTER — Ambulatory Visit: Payer: 59 | Attending: Internal Medicine

## 2019-01-15 ENCOUNTER — Telehealth: Payer: Self-pay

## 2019-01-15 ENCOUNTER — Ambulatory Visit (INDEPENDENT_AMBULATORY_CARE_PROVIDER_SITE_OTHER): Payer: 59

## 2019-01-15 DIAGNOSIS — Z20822 Contact with and (suspected) exposure to covid-19: Secondary | ICD-10-CM

## 2019-01-15 DIAGNOSIS — Z111 Encounter for screening for respiratory tuberculosis: Secondary | ICD-10-CM | POA: Diagnosis not present

## 2019-01-15 NOTE — Progress Notes (Addendum)
Per orders of Dr. Ethlyn Gallery, injction of PPD placed in right forearm  by Franco Collet. Pt advised to came back today 01/17/2019 for reading. PPD read negative.

## 2019-01-15 NOTE — Telephone Encounter (Signed)
Copied from [place] [id number]. Topic: Quick Communication - See Telephone Encounter >> Jan 15, 2019  9:26 AM Loma Boston wrote: CRM for notification. See Telephone encounter for: 01/15/19. Pt called in in regards to her TB nurse appt today and has sch a covid test for travel around the same time. She is planning on coming a little early so she can make both appts. Called office for her but no answer. Pt was told that sch was full insisted on being documented she was coming early.

## 2019-01-15 NOTE — Telephone Encounter (Signed)
Pt advised that she can come into the office early. Pt has been screened neg Covid

## 2019-01-16 LAB — NOVEL CORONAVIRUS, NAA: SARS-CoV-2, NAA: NOT DETECTED

## 2019-01-17 LAB — TB SKIN TEST
Induration: 0 mm
TB Skin Test: NEGATIVE

## 2019-05-17 ENCOUNTER — Ambulatory Visit: Payer: 59 | Attending: Internal Medicine

## 2019-05-17 DIAGNOSIS — Z20822 Contact with and (suspected) exposure to covid-19: Secondary | ICD-10-CM

## 2019-05-18 LAB — SARS-COV-2, NAA 2 DAY TAT

## 2019-05-18 LAB — NOVEL CORONAVIRUS, NAA: SARS-CoV-2, NAA: NOT DETECTED

## 2020-05-08 ENCOUNTER — Ambulatory Visit (INDEPENDENT_AMBULATORY_CARE_PROVIDER_SITE_OTHER): Payer: Self-pay | Admitting: Nurse Practitioner

## 2020-05-08 ENCOUNTER — Encounter: Payer: Self-pay | Admitting: Nurse Practitioner

## 2020-05-08 ENCOUNTER — Other Ambulatory Visit: Payer: Self-pay

## 2020-05-08 VITALS — BP 130/84

## 2020-05-08 DIAGNOSIS — Z113 Encounter for screening for infections with a predominantly sexual mode of transmission: Secondary | ICD-10-CM

## 2020-05-08 DIAGNOSIS — N921 Excessive and frequent menstruation with irregular cycle: Secondary | ICD-10-CM

## 2020-05-08 DIAGNOSIS — Z975 Presence of (intrauterine) contraceptive device: Secondary | ICD-10-CM

## 2020-05-08 NOTE — Progress Notes (Signed)
   Acute Office Visit  Subjective:    Patient ID: Beth Hayes, female    DOB: 10/12/1990, 30 y.o.   MRN: [id number]   HPI 30 y.o. presents today for pain and spotting with IUD. She has experienced breakthrough bleeding and cramping since Paragard insertion 10/2016. 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 She reports that she was told she has cystic ovaries [place]. She denies acne or facial hair. She has had multiple partners since last STD screening. She vapes.     Review of Systems  Constitutional: Negative.   Gastrointestinal: Positive for abdominal pain.  Genitourinary: Positive for menstrual problem and vaginal bleeding.       Objective:    Physical Exam Constitutional:      Appearance: Normal appearance.  Genitourinary:    General: Normal vulva.     Vagina: Bleeding present.     Cervix: Normal.     Comments: IUD string visible about 1 cm    BP 130/84   LMP 04/24/2020  Wt Readings from Last 3 Encounters:  11/15/18 87 lb (39.5 kg)  03/21/18 88 lb (39.9 kg)  10/17/17 88 lb (39.9 kg)        Assessment & Plan:   Problem List Items Addressed This Visit   None   Visit Diagnoses    Screen for STD (sexually transmitted disease)    -  Primary   Relevant Orders   HIV Antibody (routine testing w rflx)   RPR   SURESWAB CT/NG/T. vaginalis   Breakthrough bleeding with IUD         Plan: We discussed options to continue with Paragard or exchange for different IUD versus Nexplanon since she wants a long-acting birth control method and wants to avoid estrogen. We discussed risks of each. She is interested in either Nexplanon or Skyla and wants to think about it and discuss with parents since they will be helping her with costs. She will call with decision. STD panel pending. All questions answered. She is agreeable to plan.      Tamela Gammon DNP, 11:35 AM 05/08/2020

## 2020-05-09 LAB — SURESWAB CT/NG/T. VAGINALIS
C. trachomatis RNA, TMA: NOT DETECTED
N. gonorrhoeae RNA, TMA: NOT DETECTED
Trichomonas vaginalis RNA: NOT DETECTED

## 2020-05-09 LAB — HIV ANTIBODY (ROUTINE TESTING W REFLEX): HIV 1&2 Ab, 4th Generation: NONREACTIVE

## 2020-05-09 LAB — RPR: RPR Ser Ql: NONREACTIVE

## 2020-05-15 ENCOUNTER — Ambulatory Visit (INDEPENDENT_AMBULATORY_CARE_PROVIDER_SITE_OTHER): Payer: Managed Care, Other (non HMO) | Admitting: Nurse Practitioner

## 2020-05-15 ENCOUNTER — Other Ambulatory Visit: Payer: Self-pay

## 2020-05-15 ENCOUNTER — Encounter: Payer: Self-pay | Admitting: Nurse Practitioner

## 2020-05-15 VITALS — BP 130/84 | HR 94 | Resp 16 | Wt 87.0 lb

## 2020-05-15 DIAGNOSIS — Z30017 Encounter for initial prescription of implantable subdermal contraceptive: Secondary | ICD-10-CM | POA: Diagnosis not present

## 2020-05-15 DIAGNOSIS — Z30432 Encounter for removal of intrauterine contraceptive device: Secondary | ICD-10-CM | POA: Diagnosis not present

## 2020-05-15 NOTE — Patient Instructions (Addendum)
Nexplanon Instructions After Insertion    May use ice/Tylenol/Ibuprofen for soreness or pain, bruising is common   Keep pressure dressing on x 24 hours, the you may remove the outer wrap and band-aid. Leave Steri-strips on x 3-5 days until they start to fall off naturally.   You may shower in 24 hours   Try to avoid heavy lifting x 24 hours   If you develop fever, drainage or increased warmth from incision site-contact office immediately   Use condoms x 7 days  Etonogestrel implant What is this medicine? ETONOGESTREL (et oh noe JES trel) is a contraceptive (birth control) device. It is used to prevent pregnancy. It can be used for up to 3 years. This medicine may be used for other purposes; ask your health care provider or pharmacist if you have questions. COMMON BRAND NAME(S): Implanon, Nexplanon What should I tell my health care provider before I take this medicine? They need to know if you have any of these conditions:  abnormal vaginal bleeding  blood vessel disease or blood clots  breast, cervical, endometrial, ovarian, liver, or uterine cancer  diabetes  gallbladder disease  heart disease or recent heart attack  high blood pressure  high cholesterol or triglycerides  kidney disease  liver disease  migraine headaches  seizures  stroke  tobacco smoker  an unusual or allergic reaction to etonogestrel, anesthetics or antiseptics, other medicines, foods, dyes, or preservatives  pregnant or trying to get pregnant  breast-feeding How should I use this medicine? This device is inserted just under the skin on the inner side of your upper arm by a health care professional. Talk to your pediatrician regarding the use of this medicine in children. Special care may be needed. Overdosage: If you think you have taken too much of this medicine contact a poison control center or emergency room at once. NOTE: This medicine is only for you. Do not share this  medicine with others. What if I miss a dose? This does not apply. What may interact with this medicine? Do not take this medicine with any of the following medications:  amprenavir  fosamprenavir This medicine may also interact with the following medications:  acitretin  aprepitant  armodafinil  bexarotene  bosentan  carbamazepine  certain medicines for fungal infections like fluconazole, ketoconazole, itraconazole and voriconazole  certain medicines to treat hepatitis, HIV or AIDS  cyclosporine  felbamate  griseofulvin  lamotrigine  modafinil  oxcarbazepine  phenobarbital  phenytoin  primidone  rifabutin  rifampin  rifapentine  St. John's wort  topiramate This list may not describe all possible interactions. Give your health care provider a list of all the medicines, herbs, non-prescription drugs, or dietary supplements you use. Also tell them if you smoke, drink alcohol, or use illegal drugs. Some items may interact with your medicine. What should I watch for while using this medicine? This product does not protect you against HIV infection (AIDS) or other sexually transmitted diseases. You should be able to feel the implant by pressing your fingertips over the skin where it was inserted. Contact your doctor if you cannot feel the implant, and use a non-hormonal birth control method (such as condoms) until your doctor confirms that the implant is in place. Contact your doctor if you think that the implant may have broken or become bent while in your arm. You will receive a user card from your health care provider after the implant is inserted. The card is a record of the location of the  implant in your upper arm and when it should be removed. Keep this card with your health records. What side effects may I notice from receiving this medicine? Side effects that you should report to your doctor or health care professional as soon as possible:  allergic  reactions like skin rash, itching or hives, swelling of the face, lips, or tongue  breast lumps, breast tissue changes, or discharge  breathing problems  changes in emotions or moods  coughing up blood  if you feel that the implant may have broken or bent while in your arm  high blood pressure  pain, irritation, swelling, or bruising at the insertion site  scar at site of insertion  signs of infection at the insertion site such as fever, and skin redness, pain or discharge  signs and symptoms of a blood clot such as breathing problems; changes in vision; chest pain; severe, sudden headache; pain, swelling, warmth in the leg; trouble speaking; sudden numbness or weakness of the face, arm or leg  signs and symptoms of liver injury like dark yellow or brown urine; general ill feeling or flu-like symptoms; light-colored stools; loss of appetite; nausea; right upper belly pain; unusually weak or tired; yellowing of the eyes or skin  unusual vaginal bleeding, discharge Side effects that usually do not require medical attention (report to your doctor or health care professional if they continue or are bothersome):  acne  breast pain or tenderness  headache  irregular menstrual bleeding  nausea This list may not describe all possible side effects. Call your doctor for medical advice about side effects. You may report side effects to FDA at 1-800-FDA-1088. Where should I keep my medicine? This drug is given in a hospital or clinic and will not be stored at home. NOTE: This sheet is a summary. It may not cover all possible information. If you have questions about this medicine, talk to your doctor, pharmacist, or health care provider.  2021 Elsevier/Gold Standard (2018-10-17 11:33:04)

## 2020-05-15 NOTE — Progress Notes (Signed)
30 y.o. G0P0000 Single female presents for Paragard Removal and  Nexplanon insertion.  She has been counseled about alternative types of contraception and has decided this long acting method is the best for her.  Procedure, risks and benefits have all been explained.   LMP:  04/24/2020  After all questions were answered, consent was obtained.    Past Medical History:  Diagnosis Date  . Allergy   . Anorexia   . Bulimia   . Celiac disease   . Celiac sprue     Past Surgical History:  Procedure Laterality Date  . ADENOIDECTOMY  01/03/2012   Procedure: ADENOIDECTOMY;  Surgeon: Jodi Marble, MD;  Location: [place];  Service: ENT;  Laterality: N/A;  . BIOPSY BOWEL  age 82 mos. and age 45  . BIOPSY BOWEL     small intestine 2002  . INCISION AND DRAINAGE     [id number] by Dr Erik Obey  . INCISION AND DRAINAGE OF PERITONSILLAR ABCESS  11/26/2010   was not a tonsillectomy  . INTRAUTERINE DEVICE INSERTION  10/28/2016   Paraguard  . TONSILLECTOMY  11/26/2010   Procedure: TONSILLECTOMY;  Surgeon: Tyson Alias;  Location: [place];  Service: ENT;  Laterality: Right;  I & D right peritonsillar abscess  . TONSILLECTOMY  01/03/2012   Procedure: TONSILLECTOMY;  Surgeon: Jodi Marble, MD;  Location: [place];  Service: ENT;  Laterality: N/A;  . TONSILLECTOMY AND ADENOIDECTOMY  2013    Current Outpatient Medications on File Prior to Visit  Medication Sig Dispense Refill  . etonogestrel (NEXPLANON) 68 MG IMPL implant 1 each by Subdermal route once. Inserted 05/15/2020    . triamcinolone (NASACORT) 55 MCG/ACT AERO nasal inhaler Place 2 sprays into the nose daily. 1 Inhaler 0   No current facility-administered medications on file prior to visit.   Allergies  Allergen Reactions  . Salmon [Fish Allergy] Anaphylaxis    Also trout  . Gluten Meal Other (See Comments)    DUE TO CELIAC DISEASE  . Amoxicillin Rash  . Penicillins Rash    Vitals:    05/15/20 1423  BP: 130/84  Pulse: 94  Resp: 16   Physical Exam   Speculum inserted in vagina, Paragard string identified and grasped with ring forceps. Pt tolerated well.  Nexplanon insertion: Patient placed supine on exam table with her left arm flexed at the elbow. The location for insertion site was identified 8-10 cm from epicondyle.  Area cleansed with Betadine x 3.  Insertion site and path of insertion was anesthetized with 1% Lidocaine without epinephrine, 3cc total.  Using Nexplanon device (and after confirming present of rod in device), skin was pierced and then elevated along insertion path, passing device just under the skin.  Rod released and device inserted.  Rod palpated easily.  Steri-strips applied and a pressure bandage was place over the site.  Entire procedure performed with sterile technique.   Assessment:  Paragard Removal Nexplanon insertion.  Plan:  Post procedure instructions reviewed with pt.  Questions answered.  Pt knows to call with any concerns or questions.  Pt is aware removal is due by 3 calendar years from today.

## 2020-05-19 ENCOUNTER — Encounter: Payer: Self-pay | Admitting: Nurse Practitioner

## 2020-05-27 ENCOUNTER — Ambulatory Visit: Payer: Managed Care, Other (non HMO) | Admitting: Nurse Practitioner

## 2020-06-10 ENCOUNTER — Encounter: Payer: Self-pay | Admitting: Nurse Practitioner

## 2020-06-10 ENCOUNTER — Other Ambulatory Visit: Payer: Self-pay

## 2020-06-10 ENCOUNTER — Ambulatory Visit (INDEPENDENT_AMBULATORY_CARE_PROVIDER_SITE_OTHER): Payer: Managed Care, Other (non HMO) | Admitting: Nurse Practitioner

## 2020-06-10 VITALS — BP 122/76 | Ht 60.0 in | Wt 86.0 lb

## 2020-06-10 DIAGNOSIS — Z01419 Encounter for gynecological examination (general) (routine) without abnormal findings: Secondary | ICD-10-CM

## 2020-06-10 DIAGNOSIS — Z3046 Encounter for surveillance of implantable subdermal contraceptive: Secondary | ICD-10-CM | POA: Diagnosis not present

## 2020-06-10 NOTE — Progress Notes (Signed)
   Beth Hayes 21-May-1990 [id number]   History:  30 y.o. G0 presents for annual exam without GYN complaints. Nexplanon inserted 05/08/2020. She has had some spotting but overall she is happy with it. She had paraguard prior with heavy menstrual bleeding. Sexually active with one partner, negative STD screen 04/2020. Has received Gardasil series. Normal pap history.   Gynecologic History Patient's last menstrual period was 06/01/2020. Period Duration (Days): 7 Period Pattern: (!) Irregular Menstrual Flow: Light Dysmenorrhea: None Contraception/Family planning: Nexplanon  Health Maintenance Last Pap: 10/17/2017. Results were: normal Last mammogram: Not indicated Last colonoscopy: Not indicated Last Dexa: Not indicated  Past medical history, past surgical history, family history and social history were all reviewed and documented in the EPIC chart. In med school.   ROS:  A ROS was performed and pertinent positives and negatives are included.  Exam:  Vitals:   06/10/20 1457  BP: 122/76  Weight: 86 lb (39 kg)  Height: 5' (1.524 m)   Body mass index is 16.8 kg/m.  General appearance:  Normal Thyroid:  Symmetrical, normal in size, without palpable masses or nodularity. Respiratory  Auscultation:  Clear without wheezing or rhonchi Cardiovascular  Auscultation:  Regular rate, without rubs, murmurs or gallops  Edema/varicosities:  Not grossly evident Abdominal  Soft,nontender, without masses, guarding or rebound.  Liver/spleen:  No organomegaly noted  Hernia:  None appreciated  Skin  Inspection:  Grossly normal Breasts: Examined lying and sitting.   Right: Without masses, retractions, nipple discharge or axillary adenopathy.   Left: Without masses, retractions, nipple discharge or axillary adenopathy. Genitourinary   Inguinal/mons:  Normal without inguinal adenopathy  External genitalia:  Normal appearing vulva with no masses, tenderness, or  lesions  BUS/Urethra/Skene's glands:  Normal  Vagina:  Normal appearing with normal color and discharge, no lesions. Bleeding from menses  Cervix:  Normal appearing without discharge or lesions  Uterus:  Normal in size, shape and contour.  Midline and mobile, nontender  Adnexa/parametria:     Rt: Normal in size, without masses or tenderness.   Lt: Normal in size, without masses or tenderness.  Anus and perineum: Normal  Assessment/Plan:  30 y.o. G0 for annual exam.    Well female exam with routine gynecological exam - Plan: CBC with Differential/Platelet, Comprehensive metabolic panel, Cytology - PAP( [facility]). Education provided on SBEs, importance of preventative screenings, current guidelines, high calcium diet, regular exercise, and multivitamin daily.   Encounter for surveillance of implantable subdermal contraceptive - Nexplanon inserted 05/08/2020. She has had some light spotting but overall she is happy with it. She had paraguard prior with heavy menstrual bleeding.   Screening for cervical cancer - normal pap history. Pap today.   Return in 1 year for annual.   Tamela Gammon DNP, 3:09 PM 06/10/2020

## 2020-06-10 NOTE — Patient Instructions (Signed)
Health Maintenance, Female Adopting a healthy lifestyle and getting preventive care are important in promoting health and wellness. Ask your health care provider about:  The right schedule for you to have regular tests and exams.  Things you can do on your own to prevent diseases and keep yourself healthy. What should I know about diet, weight, and exercise? Eat a healthy diet  Eat a diet that includes plenty of vegetables, fruits, low-fat dairy products, and lean protein.  Do not eat a lot of foods that are high in solid fats, added sugars, or sodium.   Maintain a healthy weight Body mass index (BMI) is used to identify weight problems. It estimates body fat based on height and weight. Your health care provider can help determine your BMI and help you achieve or maintain a healthy weight. Get regular exercise Get regular exercise. This is one of the most important things you can do for your health. Most adults should:  Exercise for at least 150 minutes each week. The exercise should increase your heart rate and make you sweat (moderate-intensity exercise).  Do strengthening exercises at least twice a week. This is in addition to the moderate-intensity exercise.  Spend less time sitting. Even light physical activity can be beneficial. Watch cholesterol and blood lipids Have your blood tested for lipids and cholesterol at 28 years of age, then have this test every 5 years. Have your cholesterol levels checked more often if:  Your lipid or cholesterol levels are high.  You are older than 28 years of age.  You are at high risk for heart disease. What should I know about cancer screening? Depending on your health history and family history, you may need to have cancer screening at various ages. This may include screening for:  Breast cancer.  Cervical cancer.  Colorectal cancer.  Skin cancer.  Lung cancer. What should I know about heart disease, diabetes, and high blood  pressure? Blood pressure and heart disease  High blood pressure causes heart disease and increases the risk of stroke. This is more likely to develop in people who have high blood pressure readings, are of African descent, or are overweight.  Have your blood pressure checked: ? Every 3-5 years if you are 18-39 years of age. ? Every year if you are 40 years old or older. Diabetes Have regular diabetes screenings. This checks your fasting blood sugar level. Have the screening done:  Once every three years after age 40 if you are at a normal weight and have a low risk for diabetes.  More often and at a younger age if you are overweight or have a high risk for diabetes. What should I know about preventing infection? Hepatitis B If you have a higher risk for hepatitis B, you should be screened for this virus. Talk with your health care provider to find out if you are at risk for hepatitis B infection. Hepatitis C Testing is recommended for:  Everyone born from 1945 through 1965.  Anyone with known risk factors for hepatitis C. Sexually transmitted infections (STIs)  Get screened for STIs, including gonorrhea and chlamydia, if: ? You are sexually active and are younger than 28 years of age. ? You are older than 28 years of age and your health care provider tells you that you are at risk for this type of infection. ? Your sexual activity has changed since you were last screened, and you are at increased risk for chlamydia or gonorrhea. Ask your health care provider   if you are at risk.  Ask your health care provider about whether you are at high risk for HIV. Your health care provider may recommend a prescription medicine to help prevent HIV infection. If you choose to take medicine to prevent HIV, you should first get tested for HIV. You should then be tested every 3 months for as long as you are taking the medicine. Pregnancy  If you are about to stop having your period (premenopausal) and  you may become pregnant, seek counseling before you get pregnant.  Take 400 to 800 micrograms (mcg) of folic acid every day if you become pregnant.  Ask for birth control (contraception) if you want to prevent pregnancy. Osteoporosis and menopause Osteoporosis is a disease in which the bones lose minerals and strength with aging. This can result in bone fractures. If you are 65 years old or older, or if you are at risk for osteoporosis and fractures, ask your health care provider if you should:  Be screened for bone loss.  Take a calcium or vitamin D supplement to lower your risk of fractures.  Be given hormone replacement therapy (HRT) to treat symptoms of menopause. Follow these instructions at home: Lifestyle  Do not use any products that contain nicotine or tobacco, such as cigarettes, e-cigarettes, and chewing tobacco. If you need help quitting, ask your health care provider.  Do not use street drugs.  Do not share needles.  Ask your health care provider for help if you need support or information about quitting drugs. Alcohol use  Do not drink alcohol if: ? Your health care provider tells you not to drink. ? You are pregnant, may be pregnant, or are planning to become pregnant.  If you drink alcohol: ? Limit how much you use to 0-1 drink a day. ? Limit intake if you are breastfeeding.  Be aware of how much alcohol is in your drink. In the [place] one drink equals one 12 oz bottle of beer (355 mL), one 5 oz glass of wine (148 mL), or one 1 oz glass of hard liquor (44 mL). General instructions  Schedule regular health, dental, and eye exams.  Stay current with your vaccines.  Tell your health care provider if: ? You often feel depressed. ? You have ever been abused or do not feel safe at home. Summary  Adopting a healthy lifestyle and getting preventive care are important in promoting health and wellness.  Follow your health care provider's instructions about healthy  diet, exercising, and getting tested or screened for diseases.  Follow your health care provider's instructions on monitoring your cholesterol and blood pressure. This information is not intended to replace advice given to you by your health care provider. Make sure you discuss any questions you have with your health care provider. Document Revised: 12/28/2017 Document Reviewed: 12/28/2017 Elsevier Patient Education  2021 Elsevier Inc.  

## 2020-06-11 ENCOUNTER — Other Ambulatory Visit: Payer: Self-pay | Admitting: Nurse Practitioner

## 2020-06-11 ENCOUNTER — Encounter: Payer: Self-pay | Admitting: Nurse Practitioner

## 2020-06-11 DIAGNOSIS — D500 Iron deficiency anemia secondary to blood loss (chronic): Secondary | ICD-10-CM

## 2020-06-11 LAB — COMPREHENSIVE METABOLIC PANEL
AG Ratio: 2.2 (calc) (ref 1.0–2.5)
ALT: 9 U/L (ref 6–29)
AST: 14 U/L (ref 10–30)
Albumin: 4.6 g/dL (ref 3.6–5.1)
Alkaline phosphatase (APISO): 47 U/L (ref 31–125)
BUN: 13 mg/dL (ref 7–25)
CO2: 24 mmol/L (ref 20–32)
Calcium: 9.3 mg/dL (ref 8.6–10.2)
Chloride: 107 mmol/L (ref 98–110)
Creat: 0.94 mg/dL (ref 0.50–1.10)
Globulin: 2.1 g/dL (calc) (ref 1.9–3.7)
Glucose, Bld: 94 mg/dL (ref 65–99)
Potassium: 4.2 mmol/L (ref 3.5–5.3)
Sodium: 139 mmol/L (ref 135–146)
Total Bilirubin: 0.4 mg/dL (ref 0.2–1.2)
Total Protein: 6.7 g/dL (ref 6.1–8.1)

## 2020-06-11 LAB — CBC WITH DIFFERENTIAL/PLATELET
Absolute Monocytes: 398 cells/uL (ref 200–950)
Basophils Absolute: 41 cells/uL (ref 0–200)
Basophils Relative: 1 %
Eosinophils Absolute: 201 cells/uL (ref 15–500)
Eosinophils Relative: 4.9 %
HCT: 35.7 % (ref 35.0–45.0)
Hemoglobin: 10.7 g/dL — ABNORMAL LOW (ref 11.7–15.5)
Lymphs Abs: 1521 cells/uL (ref 850–3900)
MCH: 22.9 pg — ABNORMAL LOW (ref 27.0–33.0)
MCHC: 30 g/dL — ABNORMAL LOW (ref 32.0–36.0)
MCV: 76.4 fL — ABNORMAL LOW (ref 80.0–100.0)
MPV: 9.3 fL (ref 7.5–12.5)
Monocytes Relative: 9.7 %
Neutro Abs: 1939 cells/uL (ref 1500–7800)
Neutrophils Relative %: 47.3 %
Platelets: 341 10*3/uL (ref 140–400)
RBC: 4.67 10*6/uL (ref 3.80–5.10)
RDW: 15.9 % — ABNORMAL HIGH (ref 11.0–15.0)
Total Lymphocyte: 37.1 %
WBC: 4.1 10*3/uL (ref 3.8–10.8)

## 2020-06-11 MED ORDER — FERROUS SULFATE 325 (65 FE) MG PO TBEC: 325.0000 mg | DELAYED_RELEASE_TABLET | Freq: Two times a day (BID) | ORAL | 0 refills | Status: DC

## 2020-06-14 ENCOUNTER — Encounter (HOSPITAL_COMMUNITY): Payer: Self-pay | Admitting: Emergency Medicine

## 2020-06-14 ENCOUNTER — Emergency Department (HOSPITAL_COMMUNITY)
Admission: EM | Admit: 2020-06-14 | Discharge: 2020-06-14 | Disposition: A | Discharge status: Home / Self Care | Payer: Managed Care, Other (non HMO) | Attending: Emergency Medicine | Admitting: Emergency Medicine

## 2020-06-14 ENCOUNTER — Other Ambulatory Visit: Payer: Self-pay

## 2020-06-14 DIAGNOSIS — Z87891 Personal history of nicotine dependence: Secondary | ICD-10-CM | POA: Insufficient documentation

## 2020-06-14 DIAGNOSIS — H1031 Unspecified acute conjunctivitis, right eye: Secondary | ICD-10-CM

## 2020-06-14 DIAGNOSIS — H109 Unspecified conjunctivitis: Secondary | ICD-10-CM | POA: Diagnosis not present

## 2020-06-14 MED ORDER — POLYMYXIN B-TRIMETHOPRIM 10000-0.1 UNIT/ML-% OP SOLN: 1.0000 [drp] | OPHTHALMIC | 0 refills | Status: DC

## 2020-06-14 NOTE — Discharge Instructions (Addendum)
Contact a health care provider if: Your symptoms do not improve with treatment, or they get worse. You have increased pain. Your vision becomes blurry. You have a fever. You have facial pain, redness, or swelling. You have yellow or green drainage coming from your eye. You have new symptoms. Get help right away if: You develop severe pain. Your vision gets much worse.

## 2020-06-14 NOTE — ED Provider Notes (Signed)
[facility] EMERGENCY DEPARTMENT Provider Note   CSN: [id number] Arrival date & time: 06/14/20  5277     History Chief Complaint  Patient presents with  . Conjunctivitis    Beth Hayes  30 y.o. female with burning, redness, discharge and mattering in right eye for 1 days.  No other symptoms.  No significant prior ophthalmological history. No change in visual acuity, no photophobia, no severe eye pain, no eye trauma.     HPI     Past Medical History:  Diagnosis Date  . Allergy   . Anorexia   . Bulimia   . Celiac disease   . Celiac sprue     Patient Active Problem List   Diagnosis Date Noted  . Anxiety state 10/07/2015  . Hx of eating disorder 10/07/2015  . Celiac sprue     Past Surgical History:  Procedure Laterality Date  . ADENOIDECTOMY  01/03/2012   Procedure: ADENOIDECTOMY;  Surgeon: Jodi Marble, MD;  Location: [place];  Service: ENT;  Laterality: N/A;  . BIOPSY BOWEL  age 44 mos. and age 77  . BIOPSY BOWEL     small intestine 2002  . INCISION AND DRAINAGE     [id number] by Dr Erik Obey  . INCISION AND DRAINAGE OF PERITONSILLAR ABCESS  11/26/2010   was not a tonsillectomy  . INTRAUTERINE DEVICE INSERTION  10/28/2016   Paraguard  . TONSILLECTOMY  11/26/2010   Procedure: TONSILLECTOMY;  Surgeon: Tyson Alias;  Location: [place];  Service: ENT;  Laterality: Right;  I & D right peritonsillar abscess  . TONSILLECTOMY  01/03/2012   Procedure: TONSILLECTOMY;  Surgeon: Jodi Marble, MD;  Location: [place];  Service: ENT;  Laterality: N/A;  . TONSILLECTOMY AND ADENOIDECTOMY  2013     OB History    Gravida  0   Para  0   Term  0   Preterm  0   AB  0   Living  0     SAB  0   IAB  0   Ectopic  0   Multiple  0   Live Births              Family History  Problem Relation Age of Onset  . Cancer Paternal Grandfather        pancreatic  . Alcohol abuse  Paternal Grandfather   . Heart disease Paternal Grandmother        a. fib  . Other Other        fibromuscular dysplasia renal artery  . Mental illness Maternal Grandmother     Social History   Tobacco Use  . Smoking status: Former Smoker    Types: E-cigarettes    Quit date: 08/28/2018    Years since quitting: 1.7  . Smokeless tobacco: Never Used  . Tobacco comment: has patch now  Vaping Use  . Vaping Use: Former  Substance Use Topics  . Alcohol use: Yes    Alcohol/week: 0.0 standard drinks    Comment: Occas  . Drug use: Never    Home Medications Prior to Admission medications   Medication Sig Start Date End Date Taking? Authorizing Provider  etonogestrel (NEXPLANON) 68 MG IMPL implant 1 each by Subdermal route once. Inserted 05/15/2020    [provider]  ferrous sulfate 325 (65 FE) MG EC tablet Take 1 tablet (325 mg total) by mouth 2 (two) times daily with a meal. 06/11/20 09/09/20  Marny Lowenstein  A, NP  triamcinolone (NASACORT) 55 MCG/ACT AERO nasal inhaler Place 2 sprays into the nose daily. 05/27/16   Wynona Luna, MD    Allergies    Hyman Hopes allergy], Gluten meal, Amoxicillin, and Penicillins  Review of Systems   Review of Systems  Constitutional: Negative for chills and fever.  Eyes: Positive for discharge, redness and itching. Negative for photophobia, pain and visual disturbance.    Physical Exam Updated Vital Signs BP (!) 155/86 (BP Location: Right Arm)   Pulse (!) 121   Temp 98.8 F (37.1 C) (Oral)   Resp 20   Ht 4' 11"  (1.499 m)   Wt 41 kg   LMP 06/01/2020   SpO2 100%   BMI 18.26 kg/m   Physical Exam Vitals and nursing note reviewed.  Constitutional:      General: She is not in acute distress.    Appearance: She is well-developed. She is not diaphoretic.  HENT:     Head: Normocephalic and atraumatic.  Eyes:     General: Lids are normal. Lids are everted, no foreign bodies appreciated. Vision grossly intact. No scleral  icterus.    Conjunctiva/sclera:     Right eye: Right conjunctiva is injected. Chemosis present.     Left eye: Left conjunctiva is not injected. No chemosis. Cardiovascular:     Rate and Rhythm: Normal rate and regular rhythm.     Heart sounds: Normal heart sounds. No murmur heard. No friction rub. No gallop.   Pulmonary:     Effort: Pulmonary effort is normal. No respiratory distress.     Breath sounds: Normal breath sounds.  Abdominal:     General: Bowel sounds are normal. There is no distension.     Palpations: Abdomen is soft. There is no mass.     Tenderness: There is no abdominal tenderness. There is no guarding.  Musculoskeletal:     Cervical back: Normal range of motion.  Skin:    General: Skin is warm and dry.  Neurological:     Mental Status: She is alert and oriented to person, place, and time.  Psychiatric:        Behavior: Behavior normal.     ED Results / Procedures / Treatments   Labs (all labs ordered are listed, but only abnormal results are displayed) Labs Reviewed - No data to display  EKG None  Radiology No results found.  Procedures Procedures   Medications Ordered in ED Medications - No data to display  ED Course  I have reviewed the triage vital signs and the nursing notes.  Pertinent labs & imaging results that were available during my care of the patient were reviewed by me and considered in my medical decision making (see chart for details).    MDM Rules/Calculators/A&P                          Patient with conjunctivitis. Treat with cortisporin eye drops. F/ with ophtoho. No concern for iritis. Final Clinical Impression(s) / ED Diagnoses Final diagnoses:  None    Rx / DC Orders ED Discharge Orders    None       Margarita Mail, PA-C 06/14/20 [id number]    Lorelle Gibbs, DO 06/14/20 1521

## 2020-06-14 NOTE — ED Notes (Signed)
Pt seen by PA at triage and discharged.

## 2020-06-14 NOTE — ED Triage Notes (Signed)
Patient woke up this morning with red, watery and itchy right eye , denies injury /no vision loss.

## 2020-06-18 ENCOUNTER — Other Ambulatory Visit: Payer: Self-pay

## 2020-06-18 ENCOUNTER — Encounter: Payer: Self-pay | Admitting: Nurse Practitioner

## 2020-06-18 DIAGNOSIS — R87610 Atypical squamous cells of undetermined significance on cytologic smear of cervix (ASC-US): Secondary | ICD-10-CM

## 2020-06-18 LAB — CYTOLOGY - PAP: Diagnosis: HIGH — AB

## 2020-06-19 NOTE — Progress Notes (Signed)
  Subjective:     Patient ID: Beth Hayes, female   DOB: [birth year], 30 y.o.   MRN: [id number]  HPI Here for colposcopy with pap smear on 06/10/2020 with results of Atypical squamous cells that cannot exclude high grade dysplasia.  Pap smear history:  10/17/2017 Negative 08/26/2014 Negative  Review of Systems   LMP: 06/24/2020 Contraception: Nexplanon 05/08/20 UPT:  negative.     Objective:   Physical Exam Genitourinary:     Colposcopy - cervix, vagina. Consent for procedure.  Acetic acid used in vagina and on vulva. White light and green light filter used.  Colposcopy satisfactory:  Yes   __x___          No    _____ Findings:    Cervix:  Thin rim of acetowhite change around cervical os.  Vagina:  No lesions.  Biopsies:  ECC and biopsy at 6:00 sent to pathology separately. Monsel's placed.  Minimal EBL. No complications.      Assessment:     ASCUS, cannot rule out high grade SIL.    Plan:     Follow up pathology reports.  Final plan to follow.

## 2020-07-01 ENCOUNTER — Other Ambulatory Visit (HOSPITAL_COMMUNITY)
Admission: RE | Admit: 2020-07-01 | Discharge: 2020-07-01 | Disposition: A | Discharge status: Home / Self Care | Payer: Managed Care, Other (non HMO) | Source: Ambulatory Visit | Attending: Obstetrics and Gynecology | Admitting: Obstetrics and Gynecology

## 2020-07-01 ENCOUNTER — Ambulatory Visit (INDEPENDENT_AMBULATORY_CARE_PROVIDER_SITE_OTHER): Payer: Managed Care, Other (non HMO) | Admitting: Obstetrics and Gynecology

## 2020-07-01 ENCOUNTER — Other Ambulatory Visit: Payer: Self-pay

## 2020-07-01 ENCOUNTER — Encounter: Payer: Self-pay | Admitting: Obstetrics and Gynecology

## 2020-07-01 VITALS — BP 120/70 | HR 68

## 2020-07-01 DIAGNOSIS — R87610 Atypical squamous cells of undetermined significance on cytologic smear of cervix (ASC-US): Secondary | ICD-10-CM | POA: Diagnosis present

## 2020-07-01 DIAGNOSIS — Z01812 Encounter for preprocedural laboratory examination: Secondary | ICD-10-CM

## 2020-07-01 DIAGNOSIS — N870 Mild cervical dysplasia: Secondary | ICD-10-CM

## 2020-07-01 DIAGNOSIS — R87611 Atypical squamous cells cannot exclude high grade squamous intraepithelial lesion on cytologic smear of cervix (ASC-H): Secondary | ICD-10-CM

## 2020-07-01 LAB — PREGNANCY, URINE: Preg Test, Ur: NEGATIVE

## 2020-07-01 NOTE — Patient Instructions (Signed)
Colposcopy, Care After This sheet gives you information about how to care for yourself after your procedure. Your doctor may also give you more specific instructions. If youhave problems or questions, contact your doctor. What can I expect after the procedure? If you did not have a sample of your tissue taken out (did not have a biopsy), you may only have some spotting of blood for a few days. You can go back toyour normal activities. If you had a sample of your tissue taken out, it is common to have: Soreness and mild pain. These may last for a few days. A light-headed feeling. Mild bleeding or fluid (discharge) coming from your vagina. The fluid will look dark and grainy. You may have this for a few days. The fluid may be caused by a liquid that was used during your procedure. You may need to wear a sanitary pad. Spotting of blood for at least 48 hours after the procedure. Follow these instructions at home: Medicines Take over-the-counter and prescription medicines only as told by your doctor. Ask your doctor what medicines you can start taking again. This is very important if you take blood thinners. Activity Limit your activity for the first day after your procedure as told by your doctor. For at least 3 days, or for as long as told by your doctor, avoid: Douching. Using tampons. Having sex. Return to your normal activities as told by your doctor. Ask your doctor what activities are safe for you. General instructions  Drink enough fluid to keep your pee (urine) pale yellow. Ask your doctor if you may take baths, swim, or use a hot tub. You may take showers. If you use birth control (contraception), keep using it. Keep all follow-up visits as told by your doctor. This is important.  Contact a doctor if: You get a skin rash. Get help right away if: You bleed a lot from your vagina. A lot of bleeding means you use more than one pad an hour for 2 hours in a row. You have clumps of  blood (blood clots) coming from your vagina. You have a fever or chills. You have signs of infection. This may be fluid coming from your vagina that is: Different than normal. Yellow. Bad-smelling. You have very bad pain or cramps in your lower belly that do not get better with medicine. You faint. Summary If you did not have a sample of your tissue taken out, you may only have some spotting of blood for a few days. You can go back to your normal activities. If you had a sample of your tissue taken out, it is common to have mild pain for a few days and spotting for 48 hours. Avoid douching, using tampons, and having sex for at least 3 days after the procedure or for as long as told. Get help right away if you have a lot of bleeding, very bad pain, or signs of infection. This information is not intended to replace advice given to you by your health care provider. Make sure you discuss any questions you have with your healthcare provider. Document Revised: 11/06/2019 Document Reviewed: 01/03/2019 Elsevier Patient Education  2022 Elsevier Inc.  

## 2020-07-04 ENCOUNTER — Encounter: Payer: Self-pay | Admitting: Obstetrics and Gynecology

## 2020-07-04 LAB — SURGICAL PATHOLOGY

## 2020-08-20 ENCOUNTER — Encounter: Payer: Self-pay | Admitting: Internal Medicine

## 2020-10-11 ENCOUNTER — Encounter: Payer: Self-pay | Admitting: Nurse Practitioner

## 2021-06-11 ENCOUNTER — Ambulatory Visit: Payer: Managed Care, Other (non HMO) | Admitting: Nurse Practitioner

## 2021-07-18 LAB — HM PAP SMEAR: HM Pap smear: NORMAL

## 2022-01-01 ENCOUNTER — Ambulatory Visit: Payer: Managed Care, Other (non HMO) | Admitting: Nurse Practitioner

## 2022-01-01 ENCOUNTER — Encounter: Payer: Self-pay | Admitting: Nurse Practitioner

## 2022-01-01 VITALS — BP 114/70 | HR 117 | Temp 96.5°F | Ht 60.0 in | Wt 89.6 lb

## 2022-01-01 DIAGNOSIS — F32A Depression, unspecified: Secondary | ICD-10-CM

## 2022-01-01 DIAGNOSIS — Z23 Encounter for immunization: Secondary | ICD-10-CM | POA: Diagnosis not present

## 2022-01-01 DIAGNOSIS — K900 Celiac disease: Secondary | ICD-10-CM

## 2022-01-01 DIAGNOSIS — D649 Anemia, unspecified: Secondary | ICD-10-CM | POA: Diagnosis not present

## 2022-01-01 DIAGNOSIS — Z0184 Encounter for antibody response examination: Secondary | ICD-10-CM

## 2022-01-01 DIAGNOSIS — E559 Vitamin D deficiency, unspecified: Secondary | ICD-10-CM

## 2022-01-01 DIAGNOSIS — F419 Anxiety disorder, unspecified: Secondary | ICD-10-CM | POA: Diagnosis not present

## 2022-01-01 NOTE — Assessment & Plan Note (Signed)
She has a history of anemia after having the copper IUD inserted.  She had this removed last year and had the Nexplanon placed instead.  Will check CBC and iron panel today.

## 2022-01-01 NOTE — Progress Notes (Signed)
New Patient Visit  BP 114/70   Pulse (!) 117   Temp (!) 96.5 F (35.8 C) (Temporal)   Ht 5' (1.524 m)   Wt 89 lb 9.6 oz (40.6 kg)   SpO2 99%   BMI 17.50 kg/m    Subjective:    Patient ID: Beth Hayes, female    DOB: [birth year], 29 y.o.   MRN: [id number]  CC: Chief Complaint  Patient presents with   New Patient (Initial Visit)    New patient , discuss getting allergy injection and need labs done for school hep b titer , vit d     HPI: Beth Hayes is a 31 y.o. female presents for new patient visit to establish care.  Introduced to Designer, jewellery role and practice setting.  All questions answered.  Discussed provider/patient relationship and expectations.  She has a history of anxiety and depression that is controlled with exercise, and following with a therapist.  She denies SI/HI.  She needs hepatitis B titer done for school.  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     01/01/2022    1:27 PM 03/21/2018    3:26 PM  Depression screen PHQ 2/9  Decreased Interest 1 0  Down, Depressed, Hopeless 0 0  PHQ - 2 Score 1 0  Altered sleeping 1 1  Tired, decreased energy 2 1  Change in appetite 0 0  Feeling bad or failure about yourself  1 0  Trouble concentrating 1 0  Moving slowly or fidgety/restless 0 0  Suicidal thoughts 0 0  PHQ-9 Score 6 2  Difficult doing work/chores Somewhat difficult       01/01/2022    1:28 PM  GAD 7 : Generalized Anxiety Score  Nervous, Anxious, on Edge 2  Control/stop worrying 1  Worry too much - different things 1  Trouble relaxing 1  Restless 1  Easily annoyed or irritable 1  Afraid - awful might happen 1  Total GAD 7 Score 8  Anxiety Difficulty Not difficult at all    Past Medical History:  Diagnosis Date   Allergy    Anemia 2021   Secondary to IUD (removed 2022)   Anorexia    Anxiety 2008   Not on medication   Bulimia    Celiac disease    Celiac sprue    Depression 2020    Seasonal, not on medication   GERD (gastroesophageal reflux disease) 2019   Occasional omeprazole use    Past Surgical History:  Procedure Laterality Date   ADENOIDECTOMY  01/03/2012   Procedure: ADENOIDECTOMY;  Surgeon: Jodi Marble, MD;  Location: [facility];  Service: ENT;  Laterality: N/A;   BIOPSY BOWEL  age 43 mos. and age 60   INCISION AND DRAINAGE OF PERITONSILLAR ABCESS  11/26/2010   was not a tonsillectomy   INTRAUTERINE DEVICE INSERTION  10/28/2016   Paraguard   TONSILLECTOMY  11/26/2010   Procedure: TONSILLECTOMY;  Surgeon: Tyson Alias;  Location: [facility];  Service: ENT;  Laterality: Right;  I & D right peritonsillar abscess   TONSILLECTOMY  01/03/2012   Procedure: TONSILLECTOMY;  Surgeon: Jodi Marble, MD;  Location: [facility];  Service: ENT;  Laterality: N/A;    Family History  Problem Relation Age of Onset   Cancer Paternal Grandfather        pancreatic   Alcohol abuse Paternal Grandfather    Heart disease Paternal Grandmother  a. fib   Vision loss Paternal Grandmother    Other Other        fibromuscular dysplasia renal artery   Mental illness Maternal Grandmother    Miscarriages / [place] Mother      Social History   Tobacco Use   Smoking status: Every Day    Types: E-cigarettes    Last attempt to quit: 08/28/2018    Years since quitting: 3.3   Smokeless tobacco: Never   Tobacco comments:    2016. Use Nicorette lozenges 24m anywhere from 4-7 a day. Still occasional vape use (3%, 1/2 "pod"qd  Vaping Use   Vaping Use: Some days  Substance Use Topics   Alcohol use: Yes    Alcohol/week: 0.0 standard drinks of alcohol    Comment: Occas   Drug use: Never    Current Outpatient Medications on File Prior to Visit  Medication Sig Dispense Refill   Cholecalciferol (VITAMIN D3) 50 MCG (2000 UT) TABS Take by mouth.     etonogestrel (NEXPLANON) 68 MG IMPL implant 1 each by Subdermal route once.  Inserted 05/15/2020     vitamin C (ASCORBIC ACID) 250 MG tablet Take 250 mg by mouth daily.     No current facility-administered medications on file prior to visit.     Review of Systems  Constitutional:  Positive for fatigue. Negative for fever.  HENT: Negative.    Eyes: Negative.   Respiratory: Negative.    Cardiovascular: Negative.   Gastrointestinal: Negative.   Genitourinary: Negative.   Musculoskeletal: Negative.   Skin: Negative.   Neurological: Negative.   Psychiatric/Behavioral:  Positive for dysphoric mood. The patient is nervous/anxious.       Objective:    BP 114/70   Pulse (!) 117   Temp (!) 96.5 F (35.8 C) (Temporal)   Ht 5' (1.524 m)   Wt 89 lb 9.6 oz (40.6 kg)   SpO2 99%   BMI 17.50 kg/m   Wt Readings from Last 3 Encounters:  01/01/22 89 lb 9.6 oz (40.6 kg)  06/14/20 90 lb 6.2 oz (41 kg)  06/10/20 86 lb (39 kg)    BP Readings from Last 3 Encounters:  01/01/22 114/70  07/01/20 120/70  06/14/20 (!) 155/86    Physical Exam Vitals and nursing note reviewed.  Constitutional:      General: She is not in acute distress.    Appearance: Normal appearance.  HENT:     Head: Normocephalic and atraumatic.     Right Ear: Tympanic membrane, ear canal and external ear normal.     Left Ear: Tympanic membrane, ear canal and external ear normal.  Eyes:     Conjunctiva/sclera: Conjunctivae normal.  Cardiovascular:     Rate and Rhythm: Normal rate and regular rhythm.     Pulses: Normal pulses.     Heart sounds: Normal heart sounds.  Pulmonary:     Effort: Pulmonary effort is normal.     Breath sounds: Normal breath sounds.  Abdominal:     Palpations: Abdomen is soft.     Tenderness: There is no abdominal tenderness.  Musculoskeletal:        General: Normal range of motion.     Cervical back: Normal range of motion and neck supple. No tenderness.     Right lower leg: No edema.     Left lower leg: No edema.  Lymphadenopathy:     Cervical: No cervical  adenopathy.  Skin:    General: Skin is warm and dry.  Neurological:  General: No focal deficit present.     Mental Status: She is alert and oriented to person, place, and time.     Cranial Nerves: No cranial nerve deficit.     Coordination: Coordination normal.     Gait: Gait normal.  Psychiatric:        Mood and Affect: Mood normal.        Behavior: Behavior normal.        Thought Content: Thought content normal.        Judgment: Judgment normal.        Assessment & Plan:   Problem List Items Addressed This Visit       Digestive   Celiac disease    Chronic, stable.  She has been limiting carbohydrates and not eating wheat.  Check CMP, CBC today.        Other   Anemia - Primary    She has a history of anemia after having the copper IUD inserted.  She had this removed last year and had the Nexplanon placed instead.  Will check CBC and iron panel today.      Relevant Orders   CBC with Differential/Platelet   Iron, TIBC and Ferritin Panel   Anxiety and depression    Chronic, stable.  She is currently following with a therapist and exercises routinely.  Her PHQ-9 is a 6 and her GAD-7 is an 8.  She denies SI/HI.  Continue current regimen and follow-up as symptoms worsen.  Check TSH today.      Relevant Orders   CBC with Differential/Platelet   Comprehensive metabolic panel   TSH   Other Visit Diagnoses     Immunity status testing       Will check hepatitis B titers as needed for school.   Relevant Orders   Hepatitis B surface antibody,quantitative   Need for influenza vaccination       Flu vaccine given today   Relevant Orders   Flu Vaccine QUAD 6+ mos PF IM (Fluarix Quad PF) (Completed)   Vitamin D deficiency       Will check vitamin D levels and treat based on results.   Relevant Orders   VITAMIN D 25 Hydroxy (Vit-D Deficiency, Fractures)        Follow up plan: Return in about 2 weeks (around 01/15/2022) for 1-2 weeks , CPE.

## 2022-01-01 NOTE — Assessment & Plan Note (Addendum)
Chronic, stable.  She is currently following with a therapist and exercises routinely.  Her PHQ-9 is a 6 and her GAD-7 is an 8.  She denies SI/HI.  Continue current regimen and follow-up as symptoms worsen.  Check TSH today.

## 2022-01-01 NOTE — Patient Instructions (Signed)
It was great to see you!  We are checking your labs today and will let you know the results via mychart/phone.   Let's follow-up in 1-2 weeks, sooner if you have concerns.  If a referral was placed today, you will be contacted for an appointment. Please note that routine referrals can sometimes take up to 3-4 weeks to process. Please call our office if you haven't heard anything after this time frame.  Take care,  Vance Peper, NP

## 2022-01-01 NOTE — Assessment & Plan Note (Signed)
Chronic, stable.  She has been limiting carbohydrates and not eating wheat.  Check CMP, CBC today.

## 2022-01-02 MED ORDER — VITAMIN D (ERGOCALCIFEROL) 1.25 MG (50000 UNIT) PO CAPS: 50000.0000 [IU] | ORAL_CAPSULE | ORAL | 0 refills | Status: DC

## 2022-01-02 NOTE — Addendum Note (Signed)
Addended by: Vance Peper A on: 01/02/2022 05:35 PM   Modules accepted: Orders

## 2022-01-04 LAB — CBC WITH DIFFERENTIAL/PLATELET
Absolute Monocytes: 292 cells/uL (ref 200–950)
Basophils Absolute: 19 cells/uL (ref 0–200)
Basophils Relative: 0.5 %
Eosinophils Absolute: 152 cells/uL (ref 15–500)
Eosinophils Relative: 4.1 %
HCT: 38.8 % (ref 35.0–45.0)
Hemoglobin: 13.3 g/dL (ref 11.7–15.5)
Lymphs Abs: 1114 cells/uL (ref 850–3900)
MCH: 29.7 pg (ref 27.0–33.0)
MCHC: 34.3 g/dL (ref 32.0–36.0)
MCV: 86.6 fL (ref 80.0–100.0)
MPV: 9.5 fL (ref 7.5–12.5)
Monocytes Relative: 7.9 %
Neutro Abs: 2124 cells/uL (ref 1500–7800)
Neutrophils Relative %: 57.4 %
Platelets: 272 10*3/uL (ref 140–400)
RBC: 4.48 10*6/uL (ref 3.80–5.10)
RDW: 13.1 % (ref 11.0–15.0)
Total Lymphocyte: 30.1 %
WBC: 3.7 10*3/uL — ABNORMAL LOW (ref 3.8–10.8)

## 2022-01-04 LAB — IRON,TIBC AND FERRITIN PANEL
%SAT: 35 % (calc) (ref 16–45)
Ferritin: 12 ng/mL — ABNORMAL LOW (ref 16–154)
Iron: 132 ug/dL (ref 40–190)
TIBC: 379 mcg/dL (calc) (ref 250–450)

## 2022-01-04 LAB — COMPREHENSIVE METABOLIC PANEL
AG Ratio: 2 (calc) (ref 1.0–2.5)
ALT: 10 U/L (ref 6–29)
AST: 15 U/L (ref 10–30)
Albumin: 4.5 g/dL (ref 3.6–5.1)
Alkaline phosphatase (APISO): 62 U/L (ref 31–125)
BUN: 12 mg/dL (ref 7–25)
CO2: 22 mmol/L (ref 20–32)
Calcium: 9.1 mg/dL (ref 8.6–10.2)
Chloride: 106 mmol/L (ref 98–110)
Creat: 0.87 mg/dL (ref 0.50–0.96)
Globulin: 2.2 g/dL (calc) (ref 1.9–3.7)
Glucose, Bld: 86 mg/dL (ref 65–99)
Potassium: 4.1 mmol/L (ref 3.5–5.3)
Sodium: 138 mmol/L (ref 135–146)
Total Bilirubin: 0.5 mg/dL (ref 0.2–1.2)
Total Protein: 6.7 g/dL (ref 6.1–8.1)

## 2022-01-04 LAB — VITAMIN D 25 HYDROXY (VIT D DEFICIENCY, FRACTURES): Vit D, 25-Hydroxy: 23 ng/mL — ABNORMAL LOW (ref 30–100)

## 2022-01-04 LAB — TSH: TSH: 1.28 mIU/L

## 2022-01-04 LAB — HEPATITIS B SURFACE ANTIBODY, QUANTITATIVE: Hep B S AB Quant (Post): 682 m[IU]/mL (ref >=10)

## 2022-01-18 NOTE — Progress Notes (Unsigned)
There were no vitals taken for this visit.   Subjective:    Patient ID: Beth Hayes, female    DOB: 1990-06-19, 32 y.o.   MRN: [id number]  CC: No chief complaint on file.   HPI: Beth Hayes is a 32 y.o. female presenting on 01/20/2022 for comprehensive medical examination. Current medical complaints include:{Blank single:19197::"none","***"}  She currently lives with: Menopausal Symptoms: {Blank single:19197::"yes","no"}  Depression Screen done today and results listed below:     01/01/2022    1:27 PM 03/21/2018    3:26 PM  Depression screen PHQ 2/9  Decreased Interest 1 0  Down, Depressed, Hopeless 0 0  PHQ - 2 Score 1 0  Altered sleeping 1 1  Tired, decreased energy 2 1  Change in appetite 0 0  Feeling bad or failure about yourself  1 0  Trouble concentrating 1 0  Moving slowly or fidgety/restless 0 0  Suicidal thoughts 0 0  PHQ-9 Score 6 2  Difficult doing work/chores Somewhat difficult     The patient {has/does not EGBT:51761} a history of falls. I {did/did not:19850} complete a risk assessment for falls. A plan of care for falls {was/was not:19852} documented.   Past Medical History:  Past Medical History:  Diagnosis Date   Allergy    Anemia 2021   Secondary to IUD (removed 2022)   Anorexia    Anxiety 2008   Not on medication   Bulimia    Celiac disease    Celiac sprue    Depression 2020   Seasonal, not on medication   GERD (gastroesophageal reflux disease) 2019   Occasional omeprazole use    Surgical History:  Past Surgical History:  Procedure Laterality Date   ADENOIDECTOMY  01/03/2012   Procedure: ADENOIDECTOMY;  Surgeon: Flo Shanks, MD;  Location: [facility];  Service: ENT;  Laterality: N/A;   BIOPSY BOWEL  age 90 mos. and age 25   INCISION AND DRAINAGE OF PERITONSILLAR ABCESS  11/26/2010   was not a tonsillectomy   INTRAUTERINE DEVICE INSERTION  10/28/2016   Paraguard   TONSILLECTOMY  11/26/2010    Procedure: TONSILLECTOMY;  Surgeon: Cephus Richer;  Location: [facility];  Service: ENT;  Laterality: Right;  I & D right peritonsillar abscess   TONSILLECTOMY  01/03/2012   Procedure: TONSILLECTOMY;  Surgeon: Flo Shanks, MD;  Location: [facility];  Service: ENT;  Laterality: N/A;    Medications:  Current Outpatient Medications on File Prior to Visit  Medication Sig   Cholecalciferol (VITAMIN D3) 50 MCG (2000 UT) TABS Take by mouth.   etonogestrel (NEXPLANON) 68 MG IMPL implant 1 each by Subdermal route once. Inserted 05/15/2020   vitamin C (ASCORBIC ACID) 250 MG tablet Take 250 mg by mouth daily.   Vitamin D, Ergocalciferol, (DRISDOL) 1.25 MG (50000 UNIT) CAPS capsule Take 1 capsule (50,000 Units total) by mouth every 7 (seven) days.   No current facility-administered medications on file prior to visit.    Allergies:  Allergies  Allergen Reactions   Salmon [Fish Allergy] Anaphylaxis    Also trout   Gluten Meal Other (See Comments)    DUE TO CELIAC DISEASE   Amoxicillin Rash   Penicillins Rash    Social History:  Social History   Socioeconomic History   Marital status: Single    Spouse name: Not on file   Number of children: Not on file   Years of education: Not on file   Highest education level:  Not on file  Occupational History   Not on file  Tobacco Use   Smoking status: Every Day    Types: E-cigarettes    Last attempt to quit: 08/28/2018    Years since quitting: 3.3   Smokeless tobacco: Never   Tobacco comments:    2016. Use Nicorette lozenges 2mg  anywhere from 4-7 a day. Still occasional vape use (3%, 1/2 "pod"qd  Vaping Use   Vaping Use: Some days  Substance and Sexual Activity   Alcohol use: Yes    Alcohol/week: 0.0 standard drinks of alcohol    Comment: Occas   Drug use: Never   Sexual activity: Not Currently    Partners: Male    Birth control/protection: Implant    Comment: Nexplanon inserted 05-15-20  Other Topics  Concern   Not on file  Social History Narrative   Not on file   Social Determinants of Health   Financial Resource Strain: Not on file  Food Insecurity: Not on file  Transportation Needs: Not on file  Physical Activity: Not on file  Stress: Not on file  Social Connections: Not on file  Intimate Partner Violence: Not on file   Social History   Tobacco Use  Smoking Status Every Day   Types: E-cigarettes   Last attempt to quit: 08/28/2018   Years since quitting: 3.3  Smokeless Tobacco Never  Tobacco Comments   2016. Use Nicorette lozenges 2mg  anywhere from 4-7 a day. Still occasional vape use (3%, 1/2 "pod"qd   Social History   Substance and Sexual Activity  Alcohol Use Yes   Alcohol/week: 0.0 standard drinks of alcohol   Comment: Occas    Family History:  Family History  Problem Relation Age of Onset   Cancer Paternal Grandfather        pancreatic   Alcohol abuse Paternal Grandfather    Heart disease Paternal Grandmother        a. fib   Vision loss Paternal Grandmother    Other Other        fibromuscular dysplasia renal artery   Mental illness Maternal Grandmother    Miscarriages / 2017 Mother     Past medical history, surgical history, medications, allergies, family history and social history reviewed with patient today and changes made to appropriate areas of the chart.   ROS All other ROS negative except what is listed above and in the HPI.      Objective:    There were no vitals taken for this visit.  Wt Readings from Last 3 Encounters:  01/01/22 89 lb 9.6 oz (40.6 kg)  06/14/20 90 lb 6.2 oz (41 kg)  06/10/20 86 lb (39 kg)    Physical Exam  Results for orders placed or performed in visit on 01/01/22  Hepatitis B surface antibody,quantitative  Result Value Ref Range   Hep B S AB Quant (Post) 682 > OR = 10 mIU/mL  HM PAP SMEAR  Result Value Ref Range   HM Pap smear normal   CBC with Differential/Platelet  Result Value Ref Range   WBC 3.7  (L) 3.8 - 10.8 Thousand/uL   RBC 4.48 3.80 - 5.10 Million/uL   Hemoglobin 13.3 11.7 - 15.5 g/dL   HCT 06/12/20 01/03/22 - 15.4 %   MCV 86.6 80.0 - 100.0 fL   MCH 29.7 27.0 - 33.0 pg   MCHC 34.3 32.0 - 36.0 g/dL   RDW 00.8 67.6 - 19.5 %   Platelets 272 140 - 400 Thousand/uL   MPV 9.5  7.5 - 12.5 fL   Neutro Abs 2,124 1,500 - 7,800 cells/uL   Lymphs Abs 1,114 850 - 3,900 cells/uL   Absolute Monocytes 292 200 - 950 cells/uL   Eosinophils Absolute 152 15 - 500 cells/uL   Basophils Absolute 19 0 - 200 cells/uL   Neutrophils Relative % 57.4 %   Total Lymphocyte 30.1 %   Monocytes Relative 7.9 %   Eosinophils Relative 4.1 %   Basophils Relative 0.5 %  Comprehensive metabolic panel  Result Value Ref Range   Glucose, Bld 86 65 - 99 mg/dL   BUN 12 7 - 25 mg/dL   Creat 0.87 0.50 - 0.96 mg/dL   BUN/Creatinine Ratio SEE NOTE: 6 - 22 (calc)   Sodium 138 135 - 146 mmol/L   Potassium 4.1 3.5 - 5.3 mmol/L   Chloride 106 98 - 110 mmol/L   CO2 22 20 - 32 mmol/L   Calcium 9.1 8.6 - 10.2 mg/dL   Total Protein 6.7 6.1 - 8.1 g/dL   Albumin 4.5 3.6 - 5.1 g/dL   Globulin 2.2 1.9 - 3.7 g/dL (calc)   AG Ratio 2.0 1.0 - 2.5 (calc)   Total Bilirubin 0.5 0.2 - 1.2 mg/dL   Alkaline phosphatase (APISO) 62 31 - 125 U/L   AST 15 10 - 30 U/L   ALT 10 6 - 29 U/L  Iron, TIBC and Ferritin Panel  Result Value Ref Range   Iron 132 40 - 190 mcg/dL   TIBC 379 250 - 450 mcg/dL (calc)   %SAT 35 16 - 45 % (calc)   Ferritin 12 (L) 16 - 154 ng/mL  TSH  Result Value Ref Range   TSH 1.28 mIU/L  VITAMIN D 25 Hydroxy (Vit-D Deficiency, Fractures)  Result Value Ref Range   Vit D, 25-Hydroxy 23 (L) 30 - 100 ng/mL      Assessment & Plan:   Problem List Items Addressed This Visit   None    Follow up plan: No follow-ups on file.   LABORATORY TESTING:  - Pap smear: {Blank ZOXWRU:04540::"JWJ done","not applicable","up to date","done elsewhere"}  IMMUNIZATIONS:   - Tdap: Tetanus vaccination status reviewed: {tetanus  status:315746}. - Influenza: {Blank single:19197::"Up to date","Administered today","Postponed to flu season","Refused","Given elsewhere"} - Pneumovax: {Blank single:19197::"Up to date","Administered today","Not applicable","Refused","Given elsewhere"} - Prevnar: {Blank single:19197::"Up to date","Administered today","Not applicable","Refused","Given elsewhere"} - HPV: {Blank single:19197::"Up to date","Administered today","Not applicable","Refused","Given elsewhere"} - Zostavax vaccine: {Blank single:19197::"Up to date","Administered today","Not applicable","Refused","Given elsewhere"}  SCREENING: -Mammogram: {Blank single:19197::"Up to date","Ordered today","Not applicable","Refused","Done elsewhere"}  - Colonoscopy: {Blank single:19197::"Up to date","Ordered today","Not applicable","Refused","Done elsewhere"}  - Bone Density: {Blank single:19197::"Up to date","Ordered today","Not applicable","Refused","Done elsewhere"}  -Hearing Test: {Blank single:19197::"Up to date","Ordered today","Not applicable","Refused","Done elsewhere"}  -Spirometry: {Blank single:19197::"Up to date","Ordered today","Not applicable","Refused","Done elsewhere"}   PATIENT COUNSELING:   Advised to take 1 mg of folate supplement per day if capable of pregnancy.   Sexuality: Discussed sexually transmitted diseases, partner selection, use of condoms, avoidance of unintended pregnancy  and contraceptive alternatives.   Advised to avoid cigarette smoking.  I discussed with the patient that most people either abstain from alcohol or drink within safe limits (<=14/week and <=4 drinks/occasion for males, <=7/weeks and <= 3 drinks/occasion for females) and that the risk for alcohol disorders and other health effects rises proportionally with the number of drinks per week and how often a drinker exceeds daily limits.  Discussed cessation/primary prevention of drug use and availability of treatment for abuse.   Diet: Encouraged  to adjust caloric intake to  maintain  or achieve ideal body weight, to reduce intake of dietary saturated fat and total fat, to limit sodium intake by avoiding high sodium foods and not adding table salt, and to maintain adequate dietary potassium and calcium preferably from fresh fruits, vegetables, and low-fat dairy products.    stressed the importance of regular exercise  Injury prevention: Discussed safety belts, safety helmets, smoke detector, smoking near bedding or upholstery.   Dental health: Discussed importance of regular tooth brushing, flossing, and dental visits.    NEXT PREVENTATIVE PHYSICAL DUE IN 1 YEAR. No follow-ups on file.

## 2022-01-20 ENCOUNTER — Encounter: Payer: Self-pay | Admitting: Nurse Practitioner

## 2022-01-20 ENCOUNTER — Ambulatory Visit (INDEPENDENT_AMBULATORY_CARE_PROVIDER_SITE_OTHER): Payer: Managed Care, Other (non HMO) | Admitting: Nurse Practitioner

## 2022-01-20 VITALS — BP 122/70 | HR 100 | Temp 98.0°F | Ht 60.0 in | Wt 91.0 lb

## 2022-01-20 DIAGNOSIS — D500 Iron deficiency anemia secondary to blood loss (chronic): Secondary | ICD-10-CM

## 2022-01-20 DIAGNOSIS — Z0000 Encounter for general adult medical examination without abnormal findings: Secondary | ICD-10-CM | POA: Diagnosis not present

## 2022-01-20 NOTE — Patient Instructions (Signed)
It was great to see you!  Keep up the great work!   Let's follow-up in 1 year, sooner if you have concerns.  If a referral was placed today, you will be contacted for an appointment. Please note that routine referrals can sometimes take up to 3-4 weeks to process. Please call our office if you haven't heard anything after this time frame.  Take care,  Odile Veloso, NP  

## 2022-01-20 NOTE — Assessment & Plan Note (Signed)
Ferritin levels slightly low, hemoglobin normal. Recommend she start a multivitamin daily with iron.

## 2022-01-29 ENCOUNTER — Other Ambulatory Visit: Payer: Self-pay | Admitting: Nurse Practitioner

## 2022-06-22 ENCOUNTER — Encounter: Payer: Self-pay | Admitting: Nurse Practitioner

## 2022-07-09 ENCOUNTER — Encounter: Payer: Self-pay | Admitting: Nurse Practitioner

## 2022-07-29 ENCOUNTER — Ambulatory Visit: Payer: Managed Care, Other (non HMO) | Admitting: Nurse Practitioner

## 2022-07-29 ENCOUNTER — Other Ambulatory Visit (HOSPITAL_COMMUNITY)
Admission: RE | Admit: 2022-07-29 | Discharge: 2022-07-29 | Disposition: A | Discharge status: Home / Self Care | Payer: Managed Care, Other (non HMO) | Source: Ambulatory Visit | Attending: Nurse Practitioner | Admitting: Nurse Practitioner

## 2022-07-29 ENCOUNTER — Encounter: Payer: Self-pay | Admitting: Nurse Practitioner

## 2022-07-29 VITALS — BP 116/62 | HR 75 | Temp 99.2°F | Ht 60.0 in | Wt 92.8 lb

## 2022-07-29 DIAGNOSIS — Z124 Encounter for screening for malignant neoplasm of cervix: Secondary | ICD-10-CM | POA: Diagnosis present

## 2022-07-29 DIAGNOSIS — E559 Vitamin D deficiency, unspecified: Secondary | ICD-10-CM

## 2022-07-29 MED ORDER — EPINEPHRINE 0.3 MG/0.3ML IJ SOAJ: 0.3000 mg | INTRAMUSCULAR | 1 refills | Status: AC | PRN

## 2022-07-29 MED ORDER — VITAMIN D (ERGOCALCIFEROL) 1.25 MG (50000 UNIT) PO CAPS: 50000.0000 [IU] | ORAL_CAPSULE | ORAL | 0 refills | Status: AC

## 2022-07-29 NOTE — Patient Instructions (Signed)
It was great to see you!  We will let you know your pap results when they come in.   Start vitamin D once a week for 4 weeks, then take an over the counter supplement 2,000 units daily   Let's follow-up in 1 year, sooner if you have concerns.  If a referral was placed today, you will be contacted for an appointment. Please note that routine referrals can sometimes take up to 3-4 weeks to process. Please call our office if you haven't heard anything after this time frame.  Take care,  Rodman Pickle, NP

## 2022-07-29 NOTE — Progress Notes (Signed)
Established Patient Office Visit  Subjective   Patient ID: Beth Hayes, female    DOB: [birth year]  Age: 32 y.o. MRN: [id number]  Chief Complaint  Patient presents with   Gynecologic Exam    Yearly PAP, Rx refill    HPI  Beth Hayes is here for a pap smear. She had an abnormal pap smear which required colposcopy, however the abnormal cells clear on their own. She states that she needs 2 more yearly pap smears and if they are normal, she will be back on the normal schedule. She denies vaginal discharge, bleeding, and irregular menses.   She also needs a refill of her vitamin D.     ROS See pertinent positives and negatives per HPI.    Objective:     BP 116/62 (BP Location: Left Arm)   Pulse 75   Temp 99.2 F (37.3 C) (Oral)   Ht 5' (1.524 m)   Wt 92 lb 12.8 oz (42.1 kg)   LMP 06/12/2022 (Exact Date)   SpO2 98%   BMI 18.12 kg/m    Physical Exam Vitals and nursing note reviewed. Exam conducted with a chaperone present.  Constitutional:      General: She is not in acute distress.    Appearance: Normal appearance.  HENT:     Head: Normocephalic.  Eyes:     Conjunctiva/sclera: Conjunctivae normal.  Cardiovascular:     Rate and Rhythm: Normal rate and regular rhythm.     Pulses: Normal pulses.     Heart sounds: Normal heart sounds.  Pulmonary:     Effort: Pulmonary effort is normal.     Breath sounds: Normal breath sounds.  Genitourinary:    General: Normal vulva.     Exam position: Lithotomy position.     Labia:        Right: No rash, tenderness or lesion.        Left: No rash, tenderness or lesion.      Vagina: Normal.     Cervix: Normal.     Uterus: Normal.      Adnexa: Right adnexa normal and left adnexa normal.  Musculoskeletal:     Cervical back: Normal range of motion.  Skin:    General: Skin is warm.  Neurological:     General: No focal deficit present.     Mental Status: She is alert and oriented to person, place, and  time.  Psychiatric:        Mood and Affect: Mood normal.        Behavior: Behavior normal.        Thought Content: Thought content normal.        Judgment: Judgment normal.      No results found for any visits on 07/29/22.    The ASCVD Risk score (Arnett DK, et al., 2019) failed to calculate for the following reasons:   The 2019 ASCVD risk score is only valid for ages 74 to 75    Assessment & Plan:   Problem List Items Addressed This Visit       Other   Vitamin D insufficiency - Primary    Continue vitamin D supplement once a week for 4 weeks, then start vitamin D supplement over the counter 2,000 units daily.       Other Visit Diagnoses     Screening for cervical cancer       Pap done today. Has a history of ASCUS, colpo showe LSIL. Last pap normal last year.  She needs 2 more paps yearly and if normal will go every 3-5 years.   Relevant Orders   Cytology - PAP       Return in about 1 year (around 07/29/2023) for CPE.    Gerre Scull, NP

## 2022-07-31 DIAGNOSIS — E559 Vitamin D deficiency, unspecified: Secondary | ICD-10-CM | POA: Insufficient documentation

## 2022-07-31 NOTE — Assessment & Plan Note (Signed)
Continue vitamin D supplement once a week for 4 weeks, then start vitamin D supplement over the counter 2,000 units daily.

## 2022-08-01 LAB — CYTOLOGY - PAP
Chlamydia: NEGATIVE
Comment: NEGATIVE
Comment: NORMAL
Neisseria Gonorrhea: NEGATIVE

## 2022-08-24 ENCOUNTER — Other Ambulatory Visit: Payer: Self-pay | Admitting: Nurse Practitioner

## 2022-08-25 NOTE — Telephone Encounter (Signed)
Per last visit:  Start vitamin D once a week for 4 weeks, then take an over the counter supplement 2,000 units daily.   Patient needs to start on Vitamin D over the counter     LVM for patient to return call

## 2022-09-17 ENCOUNTER — Telehealth: Payer: Self-pay | Admitting: Nurse Practitioner

## 2022-09-17 DIAGNOSIS — Z111 Encounter for screening for respiratory tuberculosis: Secondary | ICD-10-CM

## 2022-09-17 NOTE — Telephone Encounter (Signed)
Pt scheduled herself an OV for a tb ppd test for school. I spoke with pt, she does not need to see Lauren for any reason. I made her an appt for a nurse visit this coming Wednesday 09/22/22. Does there need to be an order placed?

## 2022-09-21 NOTE — Telephone Encounter (Signed)
An order was placed for PPD placement for nurse visit scheduled on 09/22/2022.

## 2022-09-22 ENCOUNTER — Ambulatory Visit: Payer: Managed Care, Other (non HMO) | Admitting: Nurse Practitioner

## 2022-09-22 ENCOUNTER — Ambulatory Visit (INDEPENDENT_AMBULATORY_CARE_PROVIDER_SITE_OTHER): Payer: Managed Care, Other (non HMO)

## 2022-09-22 DIAGNOSIS — Z23 Encounter for immunization: Secondary | ICD-10-CM

## 2022-09-22 NOTE — Progress Notes (Signed)
PPD Placement note Beth Hayes, 32 y.o. female is here today for placement of PPD test Reason for PPD test: Need for PPD Pt taken PPD test before: yes Verified in allergy area and with patient that they are not allergic to the products PPD is made of (Phenol or Tween). Yes Is patient taking any oral or IV steroid medication now or have they taken it in the last month? no Has the patient ever received the BCG vaccine?: yes Has the patient been in recent contact with anyone known or suspected of having active TB disease?: no      Date of exposure (if applicable): N/A      Name of person they were exposed to (if applicable): N/A Patient's Country of origin?: N/A O: Alert and oriented in NAD. P:  PPD placed on 09/22/2022.  Patient advised to return for reading within 48-72 hours.

## 2022-09-24 ENCOUNTER — Ambulatory Visit: Payer: Managed Care, Other (non HMO)

## 2022-09-24 DIAGNOSIS — Z111 Encounter for screening for respiratory tuberculosis: Secondary | ICD-10-CM

## 2022-09-24 NOTE — Progress Notes (Signed)
PPD Reading Note  PPD read and results entered in EpicCare.  Result: 0 mm induration.  Interpretation: negative  If test not read within 48-72 hours of initial placement, patient advised to repeat in other arm 1-3 weeks after this test.  Allergic reaction: no

## 2023-01-26 ENCOUNTER — Encounter: Payer: Self-pay | Admitting: Nurse Practitioner

## 2024-01-29 ENCOUNTER — Encounter: Payer: Self-pay | Admitting: Nurse Practitioner

## 2024-02-24 ENCOUNTER — Telehealth: Payer: Self-pay | Admitting: Nurse Practitioner

## 2024-02-24 ENCOUNTER — Encounter: Payer: Self-pay | Admitting: Nurse Practitioner

## 2024-02-24 VITALS — Ht 60.0 in

## 2024-02-24 DIAGNOSIS — F32A Depression, unspecified: Secondary | ICD-10-CM

## 2024-02-24 DIAGNOSIS — Z720 Tobacco use: Secondary | ICD-10-CM | POA: Insufficient documentation

## 2024-02-24 DIAGNOSIS — R233 Spontaneous ecchymoses: Secondary | ICD-10-CM | POA: Insufficient documentation

## 2024-02-24 MED ORDER — BUPROPION HCL ER (XL) 150 MG PO TB24: 150.0000 mg | ORAL_TABLET | Freq: Every day | ORAL | 2 refills | Status: AC

## 2024-02-24 MED ORDER — TRETINOIN 0.05 % EX CREA: TOPICAL_CREAM | Freq: Every day | CUTANEOUS | 0 refills | Status: AC

## 2024-02-24 NOTE — Assessment & Plan Note (Signed)
 Chronic nicotine  dependence with current use of nicotine  patch and occasional vaping. Wellbutrin  XL 150mg  daily prescribed to aid cessation, with dosing adjustment option available.

## 2024-02-24 NOTE — Progress Notes (Signed)
 " [name] [address] [place] [address] Dept: [phone] Dept Fax: [phone]  Virtual Video Visit  I connected with [name] on 02/24/2024 at  1:20 PM EST by a video enabled telemedicine application and verified that I am speaking with the correct person using two identifiers.  Location patient: Home Location provider: Clinic Persons participating in the virtual visit: Patient; Beth Harada, NP; Laymon Gladis Sharps, CMA  I discussed the limitations of evaluation and management by telemedicine and the availability of in person appointments. The patient expressed understanding and agreed to proceed.  Chief Complaint  Patient presents with   Depression    Wants to discuss starting medication for depression, concerns with skin and request a Rx of Tretinoin     SUBJECTIVE:  HPI: Beth Hayes is a 34 y.o. female who presents with depression.  Discussed the use of AI scribe software for clinical note transcription with the patient, who gave verbal consent to proceed.  She reports several months of worsening depressive symptoms related to residency stress, including frequent crying spells, low motivation, decreased interest in usual coping activities, and disrupted sleep that worsens her mood. She feels less humorous and describes her mood as lower since September.  She has baseline anxiety that has not recently worsened. She previously saw a counselor but stopped due to cost. She is interested in starting Wellbutrin  to treat both depression and nicotine  dependence.  For nicotine , she uses between one-half and one-fourth of a 7 mg patch daily and occasionally vapes, especially on days she uses less of the patch. She is highly motivated to quit and feels nicotine  has been a long-term burden.  She requests tretinoin  for early photoaging concerns, including fine lines and wrinkles, and notes some red  spots she attributes to stress. She wears sunscreen daily and avoids prolonged sun exposure.  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        02/24/2024    1:15 PM 07/29/2022    2:05 PM 01/01/2022    1:27 PM 03/21/2018    3:26 PM  Depression screen PHQ 2/9  Decreased Interest 2 0 1 0  Down, Depressed, Hopeless 1 0 0 0  PHQ - 2 Score 3 0 1 0  Altered sleeping 3 3 1 1   Tired, decreased energy 2 1 2 1   Change in appetite 0 0 0 0  Feeling bad or failure about yourself  1 0 1 0  Trouble concentrating 0 1 1 0  Moving slowly or fidgety/restless 0 0 0 0  Suicidal thoughts 0 0 0 0  PHQ-9 Score 9 5  6  2    Difficult doing work/chores Somewhat difficult Not difficult at all Somewhat difficult      Data saved with a previous flowsheet row definition      02/24/2024    1:17 PM 07/29/2022    2:06 PM 01/01/2022    1:28 PM  GAD 7 : Generalized Anxiety Score  Nervous, Anxious, on Edge 0 3  2   Control/stop worrying 2 2  1    Worry too much - different things 2 2  1    Trouble relaxing 3 0  1   Restless 0 0  1   Easily annoyed or irritable 0 0  1   Afraid - awful might happen 0 0  1   Total GAD 7  Score 7 7 8   Anxiety Difficulty Somewhat difficult Not difficult at all Not difficult at all     Data saved with a previous flowsheet row definition    Patient Active Problem List   Diagnosis Date Noted   Tobacco use 02/24/2024   Skin spots, red 02/24/2024   Vitamin D  insufficiency 07/31/2022   Anemia 01/01/2022   Anxiety and depression 01/01/2022   Hx of eating disorder 10/07/2015   Celiac disease     Past Surgical History:  Procedure Laterality Date   ADENOIDECTOMY  01/03/2012   Procedure: ADENOIDECTOMY;  Surgeon: Marlyce Finer, MD;  Location: [facility];  Service: ENT;  Laterality: N/A;   BIOPSY BOWEL  age 25 mos. and age 62   INCISION AND DRAINAGE OF PERITONSILLAR ABCESS   11/26/2010   was not a tonsillectomy   INTRAUTERINE DEVICE INSERTION  10/28/2016   [facility]agu[facility]   TONSILLECTOMY  11/26/2010   Procedure: TONSILLECTOMY;  Surgeon: Marlyce ONEIDA Finer;  Location: [facility];  Service: ENT;  Laterality: Right;  I & D right peritonsillar abscess   TONSILLECTOMY  01/03/2012   Procedure: TONSILLECTOMY;  Surgeon: Marlyce Finer, MD;  Location:  SURGERY CENTER;  Service: ENT;  Laterality: N/A;    Family History  Problem Relation Age of Onset   Cancer Paternal Grandfather        pancreatic   Alcohol abuse Paternal Grandfather    Heart disease Paternal Grandmother        a. fib   Vision loss Paternal Grandmother    Other Other        fibromuscular dysplasia renal artery   Mental illness Maternal Grandmother    Miscarriages / Stillbirths Mother     Social History[1]  Current Medications[2]  Allergies[3]  ROS: See pertinent positives and negatives per HPI.  OBSERVATIONS/OBJECTIVE:  VITALS per patient if applicable: Today's Vitals   02/24/24 1314  Height: 5' (1.524 m)   Body mass index is 18.12 kg/m.    GENERAL: Alert and oriented. Appears well and in no acute distress.  HEENT: Atraumatic. Conjunctiva clear. No obvious abnormalities on inspection of external nose and ears.  NECK: Normal movements of the head and neck.  LUNGS: On inspection, no signs of respiratory distress. Breathing rate appears normal. No obvious gross SOB, gasping or wheezing, and no conversational dyspnea.  CV: No obvious cyanosis.  MS: Moves all visible extremities without noticeable abnormality.  PSYCH/NEURO: Pleasant and cooperative. No obvious depression or anxiety. Speech and thought processing grossly intact.  ASSESSMENT AND PLAN:  Problem List Items Addressed This Visit       Other   Anxiety and depression - Primary   Chronic, not controlled. New onset depression likely related to residency stress. She denies SI/HI. Wellbutrin  XL 150mg   daily prescribed for treatment. Discussed potential side effects and advised taking with food. Follow-up scheduled in 4-6 weeks to assess medication response.      Relevant Medications   buPROPion  (WELLBUTRIN  XL) 150 MG 24 hr tablet   Tobacco use   Chronic nicotine  dependence with current use of nicotine  patch and occasional vaping. Wellbutrin  XL 150mg  daily prescribed to aid cessation, with dosing adjustment option available.      Skin spots, red   Desire to address skin barrier health and some red spots on her face. Tretinoin  prescribed for improvement and prevention.       I discussed the assessment and treatment plan with the patient. The patient was provided an opportunity to  ask questions and all were answered. The patient agreed with the plan and demonstrated an understanding of the instructions.   The patient was advised to call back or seek an in-person evaluation if the symptoms worsen or if the condition fails to improve as anticipated.   Beth DELENA Harada, NP  I,Emily Lagle,acting as a scribe for [employer], NP.,have documented all relevant documentation on the behalf of Citlally Captain DELENA Harada, NP.  I, Beth DELENA Harada, NP, have reviewed all documentation for this visit. The documentation on 02/24/2024 for the exam, diagnosis, procedures, and orders are all accurate and complete.      [1]  Social History Tobacco Use   Smoking status: Every Day    Types: E-cigarettes    Last attempt to quit: 08/28/2018    Years since quitting: 5.4    Passive exposure: Current   Smokeless tobacco: Current   Tobacco comments:     Still occasional vape use   Vaping Use   Vaping status: Some Days  Substance Use Topics   Alcohol use: Yes    Alcohol/week: 0.0 standard drinks of alcohol    Comment: Occas   Drug use: Never  [2]  Current Outpatient Medications:    B Complex Vitamins (VITAMIN B COMPLEX PO), Take by mouth daily., Disp: , Rfl:    buPROPion  (WELLBUTRIN  XL) 150 MG 24 hr  tablet, Take 1 tablet (150 mg total) by mouth daily., Disp: 30 tablet, Rfl: 2   Collagen-Vitamin C-Biotin (COLLAGEN PO), Take by mouth daily., Disp: , Rfl:    EPINEPHrine  0.3 mg/0.3 mL IJ SOAJ injection, Inject 0.3 mg into the muscle as needed for anaphylaxis., Disp: 1 each, Rfl: 1   etonogestrel  (NEXPLANON ) 68 MG IMPL implant, 1 each by Subdermal route once. Inserted 05/15/2020, Disp: , Rfl:    tretinoin  (RETIN-A ) 0.05 % cream, Apply topically at bedtime. Apply a thin layer to affected areas, Disp: 45 g, Rfl: 0   VITAMIN D  PO, Take by mouth daily., Disp: , Rfl:    vitamin C (ASCORBIC ACID) 250 MG tablet, Take 250 mg by mouth daily. (Patient not taking: Reported on 02/24/2024), Disp: , Rfl:    Vitamin D , Ergocalciferol , (DRISDOL ) 1.25 MG (50000 UNIT) CAPS capsule, Take 1 capsule (50,000 Units total) by mouth every 7 (seven) days. (Patient not taking: Reported on 02/24/2024), Disp: 4 capsule, Rfl: 0 [3]  Allergies Allergen Reactions   Salmon [Fish Allergy] Anaphylaxis    Also trout   Gluten Meal Other (See Comments)    DUE TO CELIAC DISEASE   Amoxicillin Rash   Penicillins Rash   "

## 2024-02-24 NOTE — Assessment & Plan Note (Signed)
 Chronic, not controlled. New onset depression likely related to residency stress. She denies SI/HI. Wellbutrin  XL 150mg  daily prescribed for treatment. Discussed potential side effects and advised taking with food. Follow-up scheduled in 4-6 weeks to assess medication response.

## 2024-02-24 NOTE — Assessment & Plan Note (Signed)
 Desire to address skin barrier health and some red spots on her face. Tretinoin  prescribed for improvement and prevention.

## 2024-02-24 NOTE — Patient Instructions (Signed)
 VISIT SUMMARY:  During your visit, we discussed your recent onset of depression and nicotine  dependence. We also addressed your concerns about early signs of skin aging.  YOUR PLAN:  -MAJOR DEPRESSIVE DISORDER: Major depressive disorder is a mental health condition characterized by persistent feelings of sadness and loss of interest. We believe your depression is related to residency stress. We have prescribed Wellbutrin  to help manage your symptoms. Please take it with food to minimize potential side effects such as anxiety, shaking, and constipation. We will follow up in 4-6 weeks to see how you are responding to the medication.  -NICOTINE  DEPENDENCE: Nicotine  dependence is an addiction to tobacco products. You are currently using a nicotine  patch and occasionally vaping. We have also prescribed Wellbutrin  to help you quit smoking. We can adjust the dosage if needed.  -SKIN CHANGES DUE TO CHRONIC EXPOSURE: Your concerns about early signs of skin aging, such as fine lines and wrinkles, are likely due to chronic sun exposure. We have prescribed tretinoin  to help improve and prevent these signs of aging. Continue using sunscreen daily and avoid prolonged sun exposure.  INSTRUCTIONS:  Please follow up in 4-6 weeks to assess your response to Wellbutrin . Monitor for any side effects and report them during your next visit.
